# Patient Record
Sex: Male | Born: 1945 | Race: White | Hispanic: No | Smoking: Former smoker
Health system: Southern US, Community
[De-identification: ages and names within clinical notes are randomized; demographics above are authoritative.]

## PROBLEM LIST (undated history)

## (undated) DIAGNOSIS — N189 Chronic kidney disease, unspecified: Secondary | ICD-10-CM

## (undated) DIAGNOSIS — I10 Essential (primary) hypertension: Secondary | ICD-10-CM

## (undated) DIAGNOSIS — J449 Chronic obstructive pulmonary disease, unspecified: Secondary | ICD-10-CM

## (undated) DIAGNOSIS — I6529 Occlusion and stenosis of unspecified carotid artery: Secondary | ICD-10-CM

## (undated) DIAGNOSIS — M1990 Unspecified osteoarthritis, unspecified site: Secondary | ICD-10-CM

## (undated) HISTORY — DX: Essential (primary) hypertension: I10

## (undated) HISTORY — DX: Occlusion and stenosis of unspecified carotid artery: I65.29

## (undated) HISTORY — DX: Unspecified osteoarthritis, unspecified site: M19.90

## (undated) HISTORY — PX: NEPHRECTOMY: SHX65

## (undated) HISTORY — PX: KIDNEY SURGERY: SHX687

## (undated) HISTORY — DX: Chronic kidney disease, unspecified: N18.9

## (undated) HISTORY — PX: MEDIPORT INSERTION, SINGLE: SHX2027

## (undated) HISTORY — PX: GASTROSTOMY TUBE PLACEMENT: SHX655

## (undated) HISTORY — DX: Chronic obstructive pulmonary disease, unspecified: J44.9

## (undated) HISTORY — PX: OTHER SURGICAL HISTORY: SHX169

---

## 2008-01-11 ENCOUNTER — Ambulatory Visit (HOSPITAL_COMMUNITY): Admission: RE | Admit: 2008-01-11 | Discharge: 2008-01-11 | Payer: Self-pay | Admitting: Internal Medicine

## 2008-02-16 HISTORY — PX: FEMORAL-TIBIAL BYPASS GRAFT: SHX938

## 2008-02-21 ENCOUNTER — Ambulatory Visit: Payer: Self-pay | Admitting: Vascular Surgery

## 2008-02-24 ENCOUNTER — Inpatient Hospital Stay (HOSPITAL_COMMUNITY): Admission: RE | Admit: 2008-02-24 | Discharge: 2008-02-26 | Payer: Self-pay | Admitting: Vascular Surgery

## 2008-02-24 ENCOUNTER — Ambulatory Visit: Payer: Self-pay | Admitting: Vascular Surgery

## 2008-02-24 ENCOUNTER — Encounter: Payer: Self-pay | Admitting: Vascular Surgery

## 2008-03-20 ENCOUNTER — Ambulatory Visit: Payer: Self-pay | Admitting: Vascular Surgery

## 2008-07-30 ENCOUNTER — Ambulatory Visit: Payer: Self-pay | Admitting: Vascular Surgery

## 2010-09-30 NOTE — Op Note (Signed)
NAME:  Rodney Wagner, LASOTA NO.:  [id number]   MEDICAL RECORD NO.:  [id number]          PATIENT TYPE:  INP   LOCATION:  2550                         FACILITY:  [facility]   PHYSICIAN:  Quita Skye. Hart Rochester, M.D.  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   OPERATION:  Right common femoral to popliteal (below knee) bypass using  a 6-mm Gore-Tex Propaten graft with intraoperative arteriogram.   SURGEON:  Quita Skye. Hart Rochester, MD   ASSISTANT:  Jerold Coombe, PA   ANESTHESIA:  General endotracheal.   PROCEDURE:  The patient was taken to the operating room, placed in the  supine position at which time, satisfactory general endotracheal  anesthesia was administered.  The right leg was prepped with Betadine  scrub and solution, draped in routine sterile manner.  A short  longitudinal incision was made in the inguinal area, carried down  through subcutaneous tissue and the external iliac, common femoral,  superficial and profunda femoris arteries dissected free.  There was  diffuse calcific plaquing throughout the iliac and common femoral  system, but there was a soft area above the inguinal ligament, which had  an excellent pulse.  This patient had known diffuse iliac occlusive  disease proximally.  Since the saphenous vein appeared to be quite small  on physical exam, it was decided to use primarily a Gore-Tex graft to  the below-knee position.  Therefore, a medial incision was made below  the knee avoiding the injury to the saphenous vein.  Popliteal artery  was exposed in the below-knee position and circled with vessel loops.  It was diffusely diseased proximally, but  became of better quality and  softer down to the origin of the tibioperoneal trunk, which was also  calcified.  Subfascial anatomic tunnel was created and a 6-mm Gore-Tex  Propaten graft delivered through the tunnel and the patient was  heparinized.  The popliteal artery was then occluded proximally and  distally with vessel loops, opened with a 15 blade, and extended with  Potts scissors.  Gore-Tex graft was spatulated and anastomosed end-to-  side with 6-0 Prolene.  Following this, the femoral vessels were  occluded.  Longitudinal opening made in the proximal most portion of the  common femoral artery with 15 blade and extended with Potts scissors.  There was excellent flow.  Gore-Tex anastomosed end-to-side with 6-0  Prolene, clamps released, and there was an excellent pulse in the graft.  Good Doppler flow in the anterior tibial artery.  Intraoperative  arteriogram revealed a widely patent anastomosis with diffuse disease of  the tibioperoneal trunk and the origin of the anterior tibial with few  areas of disease throughout the anterior tibial, but that being the best  vessel.  Protamine was given to reverse the heparin.  Following adequate  hemostasis, the wound  was irrigated with saline, closed in layers with  Vicryl in subcuticular fashion.  Sterile dressing applied and the  patient taken to the recovery room in satisfactory condition.      Quita Skye Hart Rochester, M.D.  Electronically Signed     JDL/MEDQ  D:  02/24/2008  T:  02/24/2008  Job:  [id number]

## 2010-09-30 NOTE — Assessment & Plan Note (Signed)
OFFICE VISIT   Rodney Wagner, Rodney Wagner  DOB:  [birth year]                                       03/20/2008  CHART#:[id number]   The patient is status post right femoral popliteal bypass grafting done  on October 9 for severe claudication in the right leg.  He has done very  well following this graft with complete resolution of his claudication  symptoms, now being able to walk up to a half mile without difficulty.  He is having no rest pain and no incisional pain at this point.  His  biggest complaint is back discomfort.   PHYSICAL EXAMINATION:  On physical exam today blood pressure is 160/89,  heart rate is 83, respirations 14.  He has excellent healing of his  right inguinal and popliteal wounds.  He has no distal edema and a 3+  popliteal graft pulse with a 2+ dorsalis pedis pulse palpable and an ABI  of 1.0 in the vascular lab.   He was reassured regarding these findings.  I have recommended that he  take one 81 mg aspirin tablet per day and he will be in touch with Dr.  Linna Darner for further pain medication for his back discomfort.  We will  follow him on a regular basis in the vascular lab for patency of the  bypass graft.   Quita Skye Hart Rochester, M.D.  Electronically Signed   JDL/MEDQ  D:  03/20/2008  T:  03/21/2008  Job:  [id number]   cc:   Erasmo Downer, MD

## 2010-09-30 NOTE — H&P (Signed)
HISTORY AND PHYSICAL EXAMINATION   February 21, 2008   Re:  Rodney Wagner, FRAYSER                  DOB:  [birth year]   CHIEF COMPLAINT:  Severe claudication right leg with ambulation.   HISTORY OF PRESENT ILLNESS:  This 65 year old male patient was referred  for further evaluation by Dr. Linna Darner because of severe claudication in  the right leg.  This patient states he is unable to walk 50 yards  without having to stop because of severe pain and tightness in his calf  and foot which extends into the lower thigh.  He recently had an  angiogram performed at [facility] which reveals approximate 50%  stenosis in his right external iliac artery with total occlusion of the  right internal iliac artery.  He has total occlusion of the right  superficial femoral artery with a stenosis in the popliteal artery just  above the knee joint with a patent below knee popliteal artery and two  vessel runoff.  He has no history of rest pain, nonhealing ulcers,  infection or ulceration but is being limited quite severely.   PAST MEDICAL HISTORY:  1. Hypertension.  2. History of left nephrectomy as a child for kidney stones.  3. Negative for diabetes, hyperlipidemia, coronary artery disease or      stroke.   PAST SURGICAL HISTORY:  Left nephrectomy.   FAMILY HISTORY:  Positive for stroke in his father who died of a  cerebral hemorrhage.  Negative for coronary artery disease or diabetes.   SOCIAL HISTORY:  He is single, has two children, is disabled because of  back problems.  He smokes a pack of cigarettes per day.  He drinks beer  on a fairly regular basis.   REVIEW OF SYSTEMS:  He does have some weight loss and loss of appetite.  No chest pain, dyspnea on exertion, PND or orthopnea.  He has had a  productive cough at times.  He has urinary frequency.  He does have a  history of depression, nervousness and diffuse joint pain.   ALLERGIES:  None.   MEDICATIONS:  1. Naproxen 500 mg one  b.i.d.  2. Meloxicam 15 mg one daily.  3. Tramadol hydrochloride 50 mg one t.i.d.  4. Diazepam 5 mg one b.i.d. p.r.n.   PHYSICAL EXAMINATION:  Vital signs:  Blood pressure 121/65, heart rate  is 43, respirations 14.  General:  He is a male patient who is in no  apparent distress, alert and oriented x3.  Neck:  Supple, 3+ carotid  pulses palpable.  No bruits are audible.  Neurologic:  Normal.  No  palpable adenopathy in the neck.  Chest:  Clear to auscultation.  Cardiovascular:  Regular rhythm.  No murmurs.  Abdomen:  Soft, nontender  with no masses.  Vascular:  He has 3+ femoral pulse on the right with  absent popliteal and distal pulses.  Left leg has a 3+ femoral, 2+  popliteal, 2+ posterior tibial pulse.  Right foot slightly cooler than  the left.  Saphenous vein appears small on both legs.   Lower extremity arterial Doppler suggests right superficial femoral  occlusive disease with ABI of 0.58 on the right and 1.0 on the left.   IMPRESSION:  1. Right superficial femoral and popliteal occlusive disease with      limiting claudication.  2. Hypertension.  3. History of left nephrectomy.  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  PLAN:  Is to admit the patient on October 9 for an elective right  femoral-popliteal bypass graft with either saphenous vein or Gore-Tex  depending on the size of the vein.  The risks and benefits have been  thoroughly discussed with the patient who would like to proceed.   Quita Skye Hart Rochester, M.D.  Electronically Signed   JDL/MEDQ  D:  02/21/2008  T:  02/22/2008  Job:  [id number]   cc:   Erasmo Downer, MD

## 2010-09-30 NOTE — Assessment & Plan Note (Signed)
OFFICE VISIT   Rodney Wagner, Rodney Wagner  DOB:  04/25/1946                                       07/30/2008  CHART#:[id number]   The patient underwent a right femoral popliteal bypass graft using a 6-  mm Gore-Tex - Propaten graft in October 2009 for severe claudication  symptoms right leg.  Initially he had an excellent result but 2-3 weeks  ago began having recurrent symptoms in his right calf.  He is not having  rest pain and has no history of nonhealing ulcers, infection or  gangrene.  He is able to ambulate about 1/2 to 1 block, at which point  he stops and rests.  He has no symptoms on the contralateral left leg.  He continues to smoke heavily.   PHYSICAL EXAMINATION:  Blood pressure 113/71, heart rate is 91,  respirations 14.  Abdomen:  Soft, nontender with no masses.  He has 3+  femoral pulse on the right with no popliteal or distal pulse.  The right  foot is adequately perfused, with no infection or ulceration, no  tenderness in the calf with good motion and sensation.  The left leg is  free of symptoms.   Lower extremity arterial Dopplers revealed an ABI of 0.74 on the right  compared to 1.02 following his bypass and is 0.99 on the left.  Scan of  the graft reveals it to be totally occluded in the right leg.   I reviewed his intraoperative arteriogram which I performed at the time  of his surgery in October 2009 and he does have severe distal disease  from his tibials down, with only two-vessel runoff and both his anterior  tibial and peroneal arteries are severely diseased.   Because he continues to have significant risk factors including the  smoking and the fact that his saphenous vein is inadequate, I do not  think any further surgery should be done unless it becomes a limb  salvage situation.  I have discussed this with him, he is in total  agreement; so, we will not plan any further surgery unless his symptoms  worsen significantly.   Quita Skye  Hart Rochester, M.D.  Electronically Signed   JDL/MEDQ  D:  07/30/2008  T:  07/31/2008  Job:  [id number]

## 2010-09-30 NOTE — Procedures (Signed)
BYPASS GRAFT EVALUATION   INDICATION:  Follow up right lower extremity bypass graft.   HISTORY:  Diabetes:  No.  Cardiac:  No.  Hypertension:  No.  Smoking:  Yes.  Previous Surgery:  Right fem-pop bypass graft on 02/27/2008.  Other:  Patient states increased right lower extremity claudication over  the past 2 weeks.   SINGLE LEVEL ARTERIAL EXAM                               RIGHT              LEFT  Brachial:                    143                142  Anterior tibial:             106                132  Posterior tibial:            65                 145  Peroneal:  Ankle/brachial index:        0.74               1.01   PREVIOUS ABI:  Date: 03/20/2008  RIGHT:  1.02  LEFT:  0.99   LOWER EXTREMITY BYPASS GRAFT DUPLEX EXAM:   DUPLEX:  Monophasic flow noted in the right common femoral and below-  knee popliteal arteries with no flow detected throughout the right lower  extremity bypass graft.   IMPRESSION:  1. Doppler signals and color flow imaging suggest a total occlusion of      the right fem-pop bypass graft.  2. Significantly decreased right ABI noted when compared to the      previous exam on 03/20/2008, with the left ABI remaining stable.   ___________________________________________  Quita Skye. Hart Rochester, M.D.   CH/MEDQ  D:  07/30/2008  T:  07/30/2008  Job:  [id number]

## 2010-10-03 NOTE — Discharge Summary (Signed)
NAME:  Rodney Wagner, [name]:  [id number]   MEDICAL RECORD NO.:  [id number]          PATIENT TYPE:  INP   LOCATION:  2002                         FACILITY:  [facility]   PHYSICIAN:  Quita Skye. Hart Rochester, M.D.  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   FINAL DIAGNOSES:  Right superficial femoral and tibial occlusive disease  with severe claudication, right leg, status post right popliteal artery  bypass grafting.   SECONDARY DIAGNOSES:  1. Hypertension.  2. History of alcohol abuse.  3. Chronic obstructive pulmonary disease.   PROCEDURES:  On February 24, 2008, he underwent a right common femoral to  popliteal below-knee bypass using a 6-mm Gore-Tex Propaten graft with  intraoperative arteriogram by Dr. Josephina Gip.   On February 24, 2008, ABI pressures on the right showed brachial 136,  triphasic; dorsalis pedis 104 monophasic and posterior tibial 98,  triphasic versus biphasic.  On the left, brachial 140, triphasic;  dorsalis pedis 140, triphasic; and posterior tibia  150 triphasic.   BRIEF HISTORY:  Rodney Wagner is a 65 year old male who Dr. Hart Rochester has been  following for peripheral vascular disease and severe claudication in his  right lower extremity.  Right femoral-popliteal artery bypass grafting  was recommended to improve the circulation and his symptoms.   HOSPITAL COURSE:  Mr. Lesar was electively admitted to the [facility] on February 24, 2008.  He underwent a previously-mentioned  operative procedure.  Postoperatively, he was transferred to the Step-  Down Unit, and felt appropriate for transfer to telemetry unit on postop  day 1.  He had an uneventful postoperative course.  In the initial  postoperative period, he did have frequent  premature atrial  contractions, but otherwise within sinus rhythm.  He had a palpable  right dorsalis pedis pulse.   His postoperative labs showed sodium of 132, potassium 4.7, glucose of  122, and baseline glucose of 97.  His BUN was 5 and creatinine 0.69.  White count of 8.5, hemoglobin 11, hematocrit 32.2, and platelet count  of 300.   On postoperative day #1, his diet was advanced and his Foley catheter  was discontinued.   On postoperative day #2, he was voiding, tolerating regular food and  ambulating without great difficulty.  Pain was well-controlled with oral  medications, thus he was felt appropriate for discharge home.  He was  discharged to home in stable and improved condition on February 26, 2008.   DISCHARGE MEDICATIONS:  1. Tramadol 50 mg p.o. t.i.d.  2. Naprosyn 500 mg b.i.d.  3. Diazepam 5 mg b.i.d.  4. Meloxicam 15 mg p.o. daily.  5. Oxycodone 5 mg 1-2 tablets p.o. q.4 h. p.r.n. pain.   DISCHARGE INSTRUCTIONS:  Continue heart-healthy diet.  Shower and clean  incision gently with soap and water.  No driving or heavy lifting for  the next 2 weeks.  He is to  see Dr. Hart Rochester on March 20, 2008, at 10:30  a.m.  Should call sooner if he develops fever greater than 101 or  evidence of drainage from incision site or increased pain.      Jerold Coombe, P.A.      Quita Skye Hart Rochester, M.D.  Electronically Signed    AWZ/MEDQ  D:  03/26/2008  T:  03/27/2008  Job:  [id number]

## 2011-02-17 LAB — COMPREHENSIVE METABOLIC PANEL
ALT: 15
AST: 24
Albumin: 3.7
Alkaline Phosphatase: 81
CO2: 27
Chloride: 100
GFR calc non Af Amer: >60
Glucose, Bld: 97
Potassium: 4.7
Total Protein: 7.1

## 2011-02-17 LAB — BASIC METABOLIC PANEL
Calcium: 7.9 — ABNORMAL LOW
GFR calc non Af Amer: >60
Glucose, Bld: 122 — ABNORMAL HIGH
Potassium: 4.7

## 2011-02-17 LAB — CROSSMATCH

## 2011-02-17 LAB — CBC
HCT: 33.2 — ABNORMAL LOW
HCT: 41.5
Hemoglobin: 11 — ABNORMAL LOW
MCHC: 32.9
MCV: 94
RBC: 3.53 — ABNORMAL LOW
RDW: 14.6
WBC: 8.6

## 2011-02-17 LAB — APTT: aPTT: 35

## 2011-02-17 LAB — URINALYSIS, ROUTINE W REFLEX MICROSCOPIC
Glucose, UA: NEGATIVE
Hgb urine dipstick: NEGATIVE

## 2011-02-17 LAB — PROTIME-INR: INR: 1

## 2011-02-17 LAB — URINE MICROSCOPIC-ADD ON

## 2011-12-17 ENCOUNTER — Encounter: Payer: Self-pay | Admitting: Vascular Surgery

## 2013-07-10 ENCOUNTER — Other Ambulatory Visit: Payer: Self-pay | Admitting: Family Medicine

## 2015-07-02 DIAGNOSIS — I10 Essential (primary) hypertension: Secondary | ICD-10-CM | POA: Diagnosis not present

## 2015-07-02 DIAGNOSIS — M545 Low back pain: Secondary | ICD-10-CM | POA: Diagnosis not present

## 2015-07-02 DIAGNOSIS — R070 Pain in throat: Secondary | ICD-10-CM | POA: Diagnosis not present

## 2015-07-03 DIAGNOSIS — R070 Pain in throat: Secondary | ICD-10-CM | POA: Diagnosis not present

## 2015-07-03 DIAGNOSIS — R51 Headache: Secondary | ICD-10-CM | POA: Diagnosis not present

## 2015-07-04 DIAGNOSIS — J439 Emphysema, unspecified: Secondary | ICD-10-CM | POA: Diagnosis not present

## 2015-07-04 DIAGNOSIS — R070 Pain in throat: Secondary | ICD-10-CM | POA: Diagnosis not present

## 2015-07-04 DIAGNOSIS — I6523 Occlusion and stenosis of bilateral carotid arteries: Secondary | ICD-10-CM | POA: Diagnosis not present

## 2015-07-04 DIAGNOSIS — R64 Cachexia: Secondary | ICD-10-CM | POA: Diagnosis not present

## 2015-07-04 DIAGNOSIS — R221 Localized swelling, mass and lump, neck: Secondary | ICD-10-CM | POA: Diagnosis not present

## 2015-07-08 DIAGNOSIS — R070 Pain in throat: Secondary | ICD-10-CM | POA: Diagnosis not present

## 2015-07-10 DIAGNOSIS — Z720 Tobacco use: Secondary | ICD-10-CM | POA: Diagnosis not present

## 2015-07-10 DIAGNOSIS — R64 Cachexia: Secondary | ICD-10-CM | POA: Diagnosis not present

## 2015-07-10 DIAGNOSIS — T8182XA Emphysema (subcutaneous) resulting from a procedure, initial encounter: Secondary | ICD-10-CM | POA: Diagnosis not present

## 2015-07-10 DIAGNOSIS — I6523 Occlusion and stenosis of bilateral carotid arteries: Secondary | ICD-10-CM | POA: Diagnosis not present

## 2015-07-10 DIAGNOSIS — C760 Malignant neoplasm of head, face and neck: Secondary | ICD-10-CM | POA: Diagnosis not present

## 2015-07-11 ENCOUNTER — Encounter: Payer: Self-pay | Admitting: Vascular Surgery

## 2015-07-11 ENCOUNTER — Other Ambulatory Visit (HOSPITAL_COMMUNITY): Payer: Self-pay | Admitting: Internal Medicine

## 2015-07-11 DIAGNOSIS — I6523 Occlusion and stenosis of bilateral carotid arteries: Secondary | ICD-10-CM | POA: Diagnosis not present

## 2015-07-11 DIAGNOSIS — J029 Acute pharyngitis, unspecified: Secondary | ICD-10-CM | POA: Diagnosis not present

## 2015-07-11 DIAGNOSIS — Z720 Tobacco use: Secondary | ICD-10-CM | POA: Diagnosis not present

## 2015-07-11 DIAGNOSIS — D104 Benign neoplasm of tonsil: Secondary | ICD-10-CM | POA: Diagnosis not present

## 2015-07-11 DIAGNOSIS — D0008 Carcinoma in situ of pharynx: Secondary | ICD-10-CM | POA: Diagnosis not present

## 2015-07-11 DIAGNOSIS — C140 Malignant neoplasm of pharynx, unspecified: Secondary | ICD-10-CM

## 2015-07-11 DIAGNOSIS — C099 Malignant neoplasm of tonsil, unspecified: Secondary | ICD-10-CM | POA: Diagnosis not present

## 2015-07-12 ENCOUNTER — Encounter: Payer: Self-pay | Admitting: Vascular Surgery

## 2015-07-12 ENCOUNTER — Ambulatory Visit (INDEPENDENT_AMBULATORY_CARE_PROVIDER_SITE_OTHER): Payer: Medicare Other | Admitting: Vascular Surgery

## 2015-07-12 ENCOUNTER — Other Ambulatory Visit: Payer: Self-pay

## 2015-07-12 VITALS — BP 138/70 | HR 68 | Temp 97.2°F | Resp 16 | Ht 69.0 in | Wt 112.0 lb

## 2015-07-12 DIAGNOSIS — I739 Peripheral vascular disease, unspecified: Principal | ICD-10-CM

## 2015-07-12 DIAGNOSIS — I779 Disorder of arteries and arterioles, unspecified: Secondary | ICD-10-CM | POA: Insufficient documentation

## 2015-07-12 NOTE — Progress Notes (Signed)
I spoke with Mr Rodney Wagner, he reported that Dr Bridgett Larsson instructed him to continue to take Aspirin, he did not give any instructions on Pletal. I called Dr Donnetta Hutching , on call for Dr Bridgett Larsson , Dr Early stated that patient can stop Pletal today.  I called patient and instructed him, patient verbalized understanding.

## 2015-07-12 NOTE — Progress Notes (Signed)
New Carotid Patient  Referred by:  Michae Kava, MD [address] [place], [place] [address]  Reason for referral: B carotid stenosis  History of Present Illness  Rodney Wagner is a 70 y.o. (03-26-1946) male prior patient of Dr. Hart Rochester from R CFA to BK pop BPG w/ PTFE who presents with chief complaint: possible cancer.  This patient is in the middle of work up for possible right oral cancer.  As part of that work-up, CT neck was completed which lead to CTA neck to evaluate relationship of possible R cancer and vascular structures.  B ICA high stenoses were found with L ICA string sign.  Patient has no history of TIA or stroke symptom.  The patient has never had amaurosis fugax or monocular blindness.  The patient has never had facial drooping or hemiplegia.  The patient has never had receptive or expressive aphasia.   The patient's risks factors for carotid disease include: HTN, active smoking.  Past Medical History  Diagnosis Date  . Arthritis   . Chronic kidney disease   . COPD (chronic obstructive pulmonary disease) (HCC)   . Hypertension   . Carotid artery occlusion     Past Surgical History  Procedure Laterality Date  . Kidney surgery      Social History   Social History  . Marital Status: Divorced    Spouse Name: N/A  . Number of Children: N/A  . Years of Education: N/A   Occupational History  . Not on file.   Social History Main Topics  . Smoking status: Former Smoker -- 1.00 packs/day    Types: Cigarettes    Quit date: 07/08/2015  . Smokeless tobacco: Never Used  . Alcohol Use: No  . Drug Use: No  . Sexual Activity: Not on file   Other Topics Concern  . Not on file   Social History Narrative     Family History  Problem Relation Age of Onset  . Aneurysm Father     Current Outpatient Prescriptions  Medication Sig Dispense Refill  . amLODipine (NORVASC) 5 MG tablet Take 5 mg by mouth daily.    [name] aspirin 81 MG chewable tablet Chew 81 mg by mouth  daily.    . fentaNYL (DURAGESIC - DOSED MCG/HR) 100 MCG/HR Place 100 mcg onto the skin every 3 (three) days.    [name] lisinopril (PRINIVIL,ZESTRIL) 20 MG tablet Take 20 mg by mouth daily.    . naproxen (NAPROSYN) 500 MG tablet Take 500 mg by mouth 2 (two) times daily with a meal.    . Omega-3 Fatty Acids (FISH OIL) 1000 MG CAPS Take 1,000 mg by mouth 3 (three) times daily.    . traMADol (ULTRAM) 50 MG tablet Take 50 mg by mouth 2 (two) times daily.    . cilostazol (PLETAL) 100 MG tablet Take 1 tablet by mouth 2 (two) times daily.    . diazepam (VALIUM) 5 MG tablet Take 1 tablet by mouth 2 (two) times daily.     No current facility-administered medications for this visit.    No Known Allergies   REVIEW OF SYSTEMS:  (Positives checked otherwise negative)  CARDIOVASCULAR:   [ ]  chest pain,  [ ]  chest pressure,  [ ]  palpitations,  [ ]  shortness of breath when laying flat,  [ ]  shortness of breath with exertion,   [x]  pain in feet when walking,  [ ]  pain in feet when laying flat, [ ]  history of blood clot in  veins (DVT),  [ ]  history of phlebitis,  [ ]  swelling in legs,  [ ]  varicose veins  PULMONARY:   [x]  recurrent cough,  [ ]  asthma,  [ ]  wheezing  NEUROLOGIC:   [ ]  weakness in arms or legs,  [ ]  numbness in arms or legs,  [ ]  difficulty speaking or slurred speech,  [ ]  temporary loss of vision in one eye,  [ ]  dizziness  HEMATOLOGIC:   [ ]  bleeding problems,  [ ]  problems with blood clotting too easily  MUSCULOSKEL:   [ ]  joint pain, [ ]  joint swelling  GASTROINTEST:   [ ]  vomiting blood,  [ ]  blood in stool     GENITOURINARY:   [ ]  burning with urination,  [ ]  blood in urine  PSYCHIATRIC:   [ ]  history of major depression  INTEGUMENTARY:   [ ]  rashes,  [ ]  ulcers  CONSTITUTIONAL:   [ ]  fever,  [ ]  chills   For VQI Use Only  PRE-ADM LIVING: Home  AMB STATUS: Ambulatory  CAD Sx: None  PRIOR CHF: None  STRESS TEST: [x]  No, [ ]  Normal, [ ]  +  ischemia, [ ]  + MI, [ ]  Both   Physical Examination  Filed Vitals:   07/12/15 0919 07/12/15 0928  BP: 128/70 138/70  Pulse: 64 68  Temp: 97.2 F (36.2 C)   Resp: 16   Height: 5\' 9"  (1.753 m)   Weight: 112 lb (50.803 kg)   SpO2: 99%    Body mass index is 16.53 kg/(m^2).   General: A&O x 3, WD, cachectic  Head: [place]/AT  Ear/Nose/Throat: Hearing grossly intact, nares w/o erythema or drainage, oropharynx w/o Erythema/Exudate, Mallampati score: 3  Eyes: PERRLA, EOMI  Neck: Supple, no nuchal rigidity, palpable fullness in submandibular position, palpable LAD (L>R)  Pulmonary: Sym exp, good air movt, CTAB, no rales, rhonchi, & wheezing  Cardiac: RRR, Nl S1, S2, no Murmurs, rubs or gallops  Vascular: Vessel Right Left  Radial Faintly Palpable Faintly Palpable  Brachial Palpable Palpable  Carotid Palpable, without bruit Palpable, without bruit  Aorta  Not palpable N/A  Femoral Palpable Palpable  Popliteal Not palpable Not palpable  PT Not Palpable Not Palpable  DP Not Palpable Not Palpable   Gastrointestinal: soft, NTND, -G/R, - HSM, - masses, - CVAT B  Musculoskeletal: M/S 5/5 throughout , Extremities without ischemic changes , healing incisions in R leg  Neurologic: CN 2-12 intact , Pain and light touch intact in extremities , Motor exam as listed above  Psychiatric: Judgment intact, Mood & affect appropriate for pt's clinical situation  Dermatologic: See M/S exam for extremity exam, no rashes otherwise noted  Lymph : No Axillary, or Inguinal lymphadenopathy    Non-Invasive Vascular Imaging  Outside CTA review:  B ICA >80%, L ICA ring of calcific near occluded, low bifurcations bilaterally  Outside Studies/Documentation 10 pages of outside documents were reviewed including: outside .   Medical Decision Making  Rodney Wagner is a 70 y.o. 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  Based on the patient's vascular studies and examination, I have offered the patient: L CEA.  He is scheduled for 07/15/14.  In the event, R carotid needs to be resected, this patient would be at risk for possible CVA  if not death without intervention on the L ICA. I discussed with the patient the risks, benefits, and alternatives to carotid endarterectomy.   The patient is aware that the risks of carotid endarterectomy include but are not limited to: bleeding, infection, stroke, myocardial infarction, death, cranial nerve injuries both temporary and permanent, neck hematoma, possible airway compromise, labile blood pressure post-operatively, cerebral hyperperfusion syndrome, and possible need for additional interventions in the future.  The patient is aware of the risks and agrees to proceed forward with the procedure.  Would have to see what ENT's plan is in regards to right side, before planning any interventions on that side.  I discussed in depth with the patient the nature of atherosclerosis, and emphasized the importance of maximal medical management including strict control of blood pressure, blood glucose, and lipid levels, obtaining regular exercise, antiplatelet agents, and cessation of smoking.   The patient is currently not on a statin: reportedly not indicated.  The patient is currently on an anti-platelet: ASA.  Patient is also on Pletal.  The patient is aware that without maximal medical management the underlying atherosclerotic disease process will progress, limiting the benefit of any interventions.  Thank you for allowing [place] to participate in this patient's care.   Leonides Sake, MD [facility] Office: [phone] Pager: [phone]  07/12/2015, 6:35 PM

## 2015-07-15 ENCOUNTER — Encounter (HOSPITAL_COMMUNITY): Payer: Self-pay

## 2015-07-15 ENCOUNTER — Telehealth: Payer: Self-pay

## 2015-07-15 ENCOUNTER — Encounter (HOSPITAL_COMMUNITY)
Admission: RE | Admit: 2015-07-15 | Discharge: 2015-07-15 | Disposition: A | Discharge status: Home / Self Care | Payer: Medicare Other | Source: Ambulatory Visit | Attending: Vascular Surgery | Admitting: Vascular Surgery

## 2015-07-15 DIAGNOSIS — J449 Chronic obstructive pulmonary disease, unspecified: Secondary | ICD-10-CM | POA: Diagnosis not present

## 2015-07-15 DIAGNOSIS — Z79899 Other long term (current) drug therapy: Secondary | ICD-10-CM | POA: Insufficient documentation

## 2015-07-15 DIAGNOSIS — N189 Chronic kidney disease, unspecified: Secondary | ICD-10-CM | POA: Diagnosis not present

## 2015-07-15 DIAGNOSIS — I129 Hypertensive chronic kidney disease with stage 1 through stage 4 chronic kidney disease, or unspecified chronic kidney disease: Secondary | ICD-10-CM | POA: Insufficient documentation

## 2015-07-15 DIAGNOSIS — Z7982 Long term (current) use of aspirin: Secondary | ICD-10-CM | POA: Insufficient documentation

## 2015-07-15 DIAGNOSIS — I6523 Occlusion and stenosis of bilateral carotid arteries: Secondary | ICD-10-CM | POA: Insufficient documentation

## 2015-07-15 DIAGNOSIS — N390 Urinary tract infection, site not specified: Secondary | ICD-10-CM

## 2015-07-15 DIAGNOSIS — Z01818 Encounter for other preprocedural examination: Secondary | ICD-10-CM | POA: Insufficient documentation

## 2015-07-15 DIAGNOSIS — Z01812 Encounter for preprocedural laboratory examination: Secondary | ICD-10-CM | POA: Insufficient documentation

## 2015-07-15 DIAGNOSIS — Z87891 Personal history of nicotine dependence: Secondary | ICD-10-CM | POA: Diagnosis not present

## 2015-07-15 DIAGNOSIS — Z905 Acquired absence of kidney: Secondary | ICD-10-CM | POA: Insufficient documentation

## 2015-07-15 DIAGNOSIS — Z95828 Presence of other vascular implants and grafts: Secondary | ICD-10-CM | POA: Diagnosis not present

## 2015-07-15 DIAGNOSIS — Z0183 Encounter for blood typing: Secondary | ICD-10-CM | POA: Diagnosis not present

## 2015-07-15 LAB — URINE MICROSCOPIC-ADD ON: RBC / HPF: NONE SEEN RBC/hpf (ref 0–5)

## 2015-07-15 LAB — URINALYSIS, ROUTINE W REFLEX MICROSCOPIC
BILIRUBIN URINE: NEGATIVE
Glucose, UA: NEGATIVE mg/dL
Hgb urine dipstick: NEGATIVE
KETONES UR: NEGATIVE mg/dL
NITRITE: NEGATIVE
Protein, ur: NEGATIVE mg/dL
Specific Gravity, Urine: 1.016 (ref 1.005–1.030)
pH: 5.5 (ref 5.0–8.0)

## 2015-07-15 LAB — COMPREHENSIVE METABOLIC PANEL
ALBUMIN: 3.9 g/dL (ref 3.5–5.0)
ALT: 9 U/L — ABNORMAL LOW (ref 17–63)
AST: 13 U/L — AB (ref 15–41)
Alkaline Phosphatase: 58 U/L (ref 38–126)
Anion gap: 10 (ref 5–15)
BILIRUBIN TOTAL: 0.5 mg/dL (ref 0.3–1.2)
BUN: 27 mg/dL — AB (ref 6–20)
CHLORIDE: 104 mmol/L (ref 101–111)
CO2: 21 mmol/L — ABNORMAL LOW (ref 22–32)
Calcium: 9.8 mg/dL (ref 8.9–10.3)
Creatinine, Ser: 0.92 mg/dL (ref 0.61–1.24)
GFR calc Af Amer: >60 mL/min (ref >60)
GFR calc non Af Amer: >60 mL/min (ref >60)
GLUCOSE: 105 mg/dL — AB (ref 65–99)
POTASSIUM: 5 mmol/L (ref 3.5–5.1)
Sodium: 135 mmol/L (ref 135–145)
Total Protein: 7.5 g/dL (ref 6.5–8.1)

## 2015-07-15 LAB — TYPE AND SCREEN
ABO/RH(D): O NEG
ANTIBODY SCREEN: NEGATIVE

## 2015-07-15 LAB — CBC
HEMATOCRIT: 29.1 % — AB (ref 39.0–52.0)
Hemoglobin: 10.3 g/dL — ABNORMAL LOW (ref 13.0–17.0)
MCH: 34.9 pg — ABNORMAL HIGH (ref 26.0–34.0)
MCHC: 35.4 g/dL (ref 30.0–36.0)
MCV: 98.6 fL (ref 78.0–100.0)
PLATELETS: 291 10*3/uL (ref 150–400)
RBC: 2.95 MIL/uL — AB (ref 4.22–5.81)
RDW: 14.7 % (ref 11.5–15.5)
WBC: 17.1 10*3/uL — AB (ref 4.0–10.5)

## 2015-07-15 LAB — SURGICAL PCR SCREEN
MRSA, PCR: NEGATIVE
Staphylococcus aureus: NEGATIVE

## 2015-07-15 LAB — PROTIME-INR
INR: 1.04 (ref 0.00–1.49)
Prothrombin Time: 13.8 seconds (ref 11.6–15.2)

## 2015-07-15 LAB — APTT: APTT: 27 s (ref 24–37)

## 2015-07-15 MED ORDER — DEXTROSE 5 % IV SOLN: 1.5000 g | INTRAVENOUS | Status: AC

## 2015-07-15 MED ORDER — SODIUM CHLORIDE 0.9 % IV SOLN: INTRAVENOUS | Status: DC

## 2015-07-15 MED ORDER — LEVOFLOXACIN 750 MG PO TABS: 750.0000 mg | ORAL_TABLET | Freq: Every day | ORAL | Status: AC

## 2015-07-15 NOTE — Progress Notes (Signed)
I notified Michae Kava. At Dr Lianne Moris office of WBC of 17.1,  Urine - rare bacteria, WBC- 6-30.

## 2015-07-15 NOTE — Progress Notes (Signed)
Mr Rodney Wagner denies cough, shortness of breath or chest pain.  Mr Rodney Wagner recently had a biospy by Dr Erik Obey- does not have results at this time.

## 2015-07-15 NOTE — Pre-Procedure Instructions (Signed)
    Nickalas Hemm  07/15/2015     Your procedure is scheduled on Tuesday,February 28.  Report to [facility] Admitting at 5:30 A.M.              Your surgery or procedure is scheduled for 7:30 AM   Call this number if you have problems the morning of surgery:[phone]   Remember:  Do not eat food or drink liquids after midnight.  Take these medicines the morning of surgery with A SIP OF WATER: Amlodipine, Aspirin.                   Take if needed: Tramadol, Valium.                Stop taking Cilostazol and Naproxen, Fish Oil,.   Do not wear jewelry, make-up or nail polish.  Do not wear lotions, powders, or perfumes.               Men may shave face and neck.  Do not bring valuables to the hospital.  [facility] is not responsible for any belongings or valuables.  Contacts, dentures or bridgework may not be worn into surgery.  Leave your suitcase in the car.  After surgery it may be brought to your room.  For patients admitted to the hospital, discharge time will be determined by your treatment team.  Patients discharged the day of surgery will not be allowed to drive home.   Special instructions:  Review  [place] - Preparing For Surgery.  Please read over the following fact sheets that you were given. Pain Booklet, Coughing and Deep Breathing, Blood Transfusion Information, MRSA Information and Surgical Site Infection Prevention

## 2015-07-15 NOTE — Progress Notes (Signed)
Anesthesia Chart Review: Patient is a 70 year old male scheduled for left CEA on 07/16/15 by Dr. Bridgett Larsson. He was just added to the OR schedule on 07/12/15 following his Friday appointment with Dr. Bridgett Larsson. He was being evaluated for possible right oral cancer (ENT Dr. Erik Obey, Oncologist Dr. Audree Camel; see [place]) and CT of the neck showed bilateral high grade ICA stenosis with string sign on the left. According to Dr. Lianne Moris office note, plan includes:  Given the lesion is primarily right side, he may need a neck dissection with possible R carotid resection.  Based on the patient's vascular studies and examination, I have offered the patient: L CEA. He is scheduled for 07/15/14.  In the event, R carotid needs to be resected, this patient would be at risk for possible CVA if not death without intervention on the L ICA.  Would have to see what ENT's plan is in regards to right side, before planning any interventions on that side.  Other history includes recent former smoker (quit 07/08/15), COPD, HTN, arthritis, CKD with history of left nephrectomy/partial bladder excision (age 28 "kidney stone"), PAD s/p right FPBG with PTFE 02/24/08 (Dr. Kellie Simmering). Denied current ETOH use. He had a right tonsillar biopsy by Dr. Erik Obey on [id number], but has not heard the pathology results yet. Dr. 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 Smoking cessation encouraged. A PET scan has been ordered pending biopsy results (scheduled for 07/25/15).   PCP is Dr. Sherrie Sport with [facility], last visit 07/08/15. No cardiac studies or prior EKGs at his office.   Meds include amlodipine, aspirin, Pletal, Valium, fentanyl patch, lisinopril, fish oil, tramadol.  07/04/15 CTA head/neck (PACS): Impression: 1. Abnormal soft tissue thickening and hyper enhancement along the lateral wall of the right oropharynx with a lepidic growth pattern.  All told the tumor encompasses about 3 X 4.7 cm. Small but malignant appearing right level IIA lymph node measuring 1.7 cm long axis. 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 2. High-grade bilateral ICA stenosis (see radiographic string sign on the left).  3. Cachexia. Emphysema.  07/15/15 EKG: SR with frequent PVCs, LAFB, right BBB. 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 He actually has fewer PVCs now (bigemmy pattern is no longer).   Preoperative labs noted. WBC 17.1. H/H 10.3/29.1. PT/PTT WNL. Glucose 105. Cr 0.92. UA showed small leukocytes, negative nitrites. UA WBC on microscopic 6-30 (some in clumps), rare bacteria. He did tell the nurse that he has chronic sinus congestion, but did not appear acutely ill at PAT. (I was not asked to evaluate patient, and he was no longer at his appointment once his labs resulted.) [facility] RN Arbie Cookey was notified of WBC and UA results. Will defer additional recommendations to surgeon.   Patient with severe bilateral ICA stenosis with string sign on the left. Unfortunately, may also have right tonsillar cancer, pathology pending from biopsy last week. His EKG is overall stable since 2009. Labs revealed leukocytosis, so awaiting any additional recommendations from Dr. Bridgett Larsson. If he plans to proceed as scheduled then further evaluation on the day of surgery to ensure patient does not appear acutely ill. He denied cough, SOB, and chest pain at PAT. May need glidescope with known right tonsillar mass. Definitive anesthesia plan following anesthesiologist evaluation.  Myra Gianotti, PA-C [facility] Short Stay  Center/Anesthesiology Phone [phone] 07/15/2015 12:12 PM

## 2015-07-15 NOTE — Pre-Procedure Instructions (Signed)
    Rodney Wagner  07/15/2015     Your procedure is scheduled on Tuesday,February 28.  Report to [facility] Admitting at 5:30 A.M.              Your surgery or procedure is scheduled for 7:30 AM   Call this number if you have problems the morning of surgery:[phone]   Remember:  Do not eat food or drink liquids after midnight.  Take these medicines the morning of surgery with A SIP OF WATER: Amlodipine, Aspirin.                   Take if needed: Tramadol, Valium.                Stop taking Cilostazol and Naproxen, Fish Oil,.   Do not wear jewelry, make-up or nail polish.  Do not wear lotions, powders, or perfumes.               Men may shave face and neck.  Do not bring valuables to the hospital.  [facility] is not responsible for any belongings or valuables.  Contacts, dentures or bridgework may not be worn into surgery.  Leave your suitcase in the car.  After surgery it may be brought to your room.  For patients admitted to the hospital, discharge time will be determined by your treatment team.  Patients discharged the day of surgery will not be allowed to drive home.   Special instructions:  Review   - Preparing For Surgery.  Please read over the following fact sheets that you were given. Pain Booklet, Coughing and Deep Breathing, Blood Transfusion Information, MRSA Information and Surgical Site Infection Prevention

## 2015-07-15 NOTE — Telephone Encounter (Signed)
rec'd new order from Dr. Bridgett Larsson to cancel pt's surgery for left CEA 2/28, and to start Levaquin 750 mg, qd x 5 days.  Pt. Has elevated WBC of 17.1, and abnormal UA.  Per Dr. Bridgett Larsson, reschedule pt's procedure in 2 weeks.  Phone call to pt.  Advised of abnormal UA and elevated WBC, and recommendation per Dr. Bridgett Larsson to cancel surgery 2/28, reschedule in 2 weeks, and order antibiotic for UTI.   Will fax rx to pharmacy.  Pt. advised to drink a lot of water with the antibiotic.   Pt. Verb. understanding.

## 2015-07-15 NOTE — Progress Notes (Signed)
   07/15/15 1020  OBSTRUCTIVE SLEEP APNEA  Have you ever been diagnosed with sleep apnea through a sleep study? No  Do you snore loudly (loud enough to be heard through closed doors)?  1  Do you often feel tired, fatigued, or sleepy during the daytime (such as falling asleep during driving or talking to someone)? 0  Has anyone observed you stop breathing during your sleep? 1  Do you have, or are you being treated for high blood pressure? 1  BMI more than 35 kg/m2? 0  Age > 43 (1-yes) 1  Male Gender (Yes=1) 1  Obstructive Sleep Apnea Score 5  Score 5 or greater  Results sent to PCP

## 2015-07-16 ENCOUNTER — Encounter: Payer: Self-pay | Admitting: Internal Medicine

## 2015-07-24 ENCOUNTER — Other Ambulatory Visit: Payer: Self-pay

## 2015-07-25 ENCOUNTER — Other Ambulatory Visit: Payer: Self-pay

## 2015-07-25 ENCOUNTER — Encounter (HOSPITAL_COMMUNITY)
Admission: RE | Admit: 2015-07-25 | Discharge: 2015-07-25 | Disposition: A | Discharge status: Home / Self Care | Payer: Medicare Other | Source: Ambulatory Visit | Attending: Vascular Surgery | Admitting: Vascular Surgery

## 2015-07-25 ENCOUNTER — Ambulatory Visit (HOSPITAL_COMMUNITY)
Admission: RE | Admit: 2015-07-25 | Discharge: 2015-07-25 | Disposition: A | Discharge status: Home / Self Care | Payer: Medicare Other | Source: Ambulatory Visit | Attending: Internal Medicine | Admitting: Internal Medicine

## 2015-07-25 ENCOUNTER — Telehealth: Payer: Self-pay

## 2015-07-25 DIAGNOSIS — I2510 Atherosclerotic heart disease of native coronary artery without angina pectoris: Secondary | ICD-10-CM | POA: Diagnosis not present

## 2015-07-25 DIAGNOSIS — C760 Malignant neoplasm of head, face and neck: Secondary | ICD-10-CM | POA: Diagnosis not present

## 2015-07-25 DIAGNOSIS — C770 Secondary and unspecified malignant neoplasm of lymph nodes of head, face and neck: Secondary | ICD-10-CM | POA: Insufficient documentation

## 2015-07-25 DIAGNOSIS — Z905 Acquired absence of kidney: Secondary | ICD-10-CM | POA: Insufficient documentation

## 2015-07-25 DIAGNOSIS — C140 Malignant neoplasm of pharynx, unspecified: Secondary | ICD-10-CM

## 2015-07-25 LAB — URINALYSIS, ROUTINE W REFLEX MICROSCOPIC
Bilirubin Urine: NEGATIVE
GLUCOSE, UA: NEGATIVE mg/dL
HGB URINE DIPSTICK: NEGATIVE
Ketones, ur: NEGATIVE mg/dL
Nitrite: NEGATIVE
Protein, ur: NEGATIVE mg/dL
SPECIFIC GRAVITY, URINE: 1.015 (ref 1.005–1.030)
pH: 5.5 (ref 5.0–8.0)

## 2015-07-25 LAB — URINE MICROSCOPIC-ADD ON

## 2015-07-25 LAB — CBC
HCT: 27.6 % — ABNORMAL LOW (ref 39.0–52.0)
Hemoglobin: 9.4 g/dL — ABNORMAL LOW (ref 13.0–17.0)
MCH: 33.3 pg (ref 26.0–34.0)
MCHC: 34.1 g/dL (ref 30.0–36.0)
MCV: 97.9 fL (ref 78.0–100.0)
PLATELETS: 283 10*3/uL (ref 150–400)
RBC: 2.82 MIL/uL — ABNORMAL LOW (ref 4.22–5.81)
RDW: 15.1 % (ref 11.5–15.5)
WBC: 11.1 10*3/uL — ABNORMAL HIGH (ref 4.0–10.5)

## 2015-07-25 LAB — BASIC METABOLIC PANEL
Anion gap: 8 (ref 5–15)
BUN: 40 mg/dL — ABNORMAL HIGH (ref 6–20)
CHLORIDE: 107 mmol/L (ref 101–111)
CO2: 22 mmol/L (ref 22–32)
CREATININE: 1.14 mg/dL (ref 0.61–1.24)
Calcium: 9.7 mg/dL (ref 8.9–10.3)
GFR calc non Af Amer: >60 mL/min (ref >60)
Glucose, Bld: 103 mg/dL — ABNORMAL HIGH (ref 65–99)
POTASSIUM: 5 mmol/L (ref 3.5–5.1)
Sodium: 137 mmol/L (ref 135–145)

## 2015-07-25 LAB — GLUCOSE, CAPILLARY: Glucose-Capillary: 96 mg/dL (ref 65–99)

## 2015-07-25 MED ORDER — FLUDEOXYGLUCOSE F - 18 (FDG) INJECTION
5.7300 | Freq: Once | INTRAVENOUS | Status: AC | PRN
  Administered 2015-07-25: 5.73 via INTRAVENOUS

## 2015-07-25 NOTE — Telephone Encounter (Signed)
-----  Message from [name], MD sent at 07/25/2015  2:20 PM EST ----- Rodney Wagner [id number] 1945-11-19  Would treat him for UTI

## 2015-07-25 NOTE — Telephone Encounter (Signed)
Discussed pt's UA with Dr. Bridgett Larsson.  Pt. was recently treated for UTI with Levaquin 750 mg qd x 1 week.  Per Dr. Bridgett Larsson, d/c order to treat for UTI today, and obtain CCU for urine culture, the AM of surgery, 3/14.

## 2015-07-25 NOTE — Pre-Procedure Instructions (Signed)
    Tashon Delee  07/25/2015      [facility] - [place], [place] [address] Phone: [phone] Fax: [phone]    Your procedure is scheduled on Tuesday, March 14th, 2017.  Report to [facility] Admitting at 5:30 A.M.  Call this number if you have problems the morning of surgery:  [phone]   Remember:  Do not eat food or drink liquids after midnight.   Take these medicines the morning of surgery with A SIP OF WATER : Amlodipine (Norvasc), Aspirin, Diazepam (Valium) if needed, Tramadol (Ultram) if needed.  Stop taking: Cilostazol (Pletal), Naproxen, Fish Oil.    Do not wear jewelry.  Do not wear lotions, powders, or cologones.    Men may shave face and neck.  Do not bring valuables to the hospital.  [facility] is not responsible for any belongings or valuables.  Contacts, dentures or bridgework may not be worn into surgery.  Leave your suitcase in the car.  After surgery it may be brought to your room.  For patients admitted to the hospital, discharge time will be determined by your treatment team.  Patients discharged the day of surgery will not be allowed to drive home.   Special instructions:  See attached.

## 2015-07-26 NOTE — Progress Notes (Signed)
Anesthesia follow-up: See my note from 07/15/15. 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 Erik Obey). CT scan showed bilateral high grade ICA stenosis with string sing on the left, and he was referred to vascular surgeon Dr. Bridgett Larsson. His plan included:   Given the lesion is primarily right side, he may need a neck dissection with possible R carotid resection.  Based on the patient's vascular studies and examination, I have offered the patient: L CEA. He is scheduled for 07/15/14.  In the event, R carotid needs to be resected, this patient would be at risk for possible CVA if not death without intervention on the L ICA.  Would have to see what ENT's plan is in regards to right side, before planning any interventions on that side.  Surgery had been scheduled for 07/16/15, but was apparently postponed until 07/30/15 due to leukocytosis and 6-30 WBC on urine microscopic.  He was treated with a five day course of Levaquin. Yesterday he had repeat labs that showed Cr 1.14, glucose 103. H/H 9.4/27.6 (previously 10./29.1 on 07/15/15). T&S done. UA showed small leukocytes, few bacteria, 6-30 WBC. According to [facility] RN note, Dr. Bridgett Larsson reviewed UA results and has ordered a "CCU for urine culture" for the morning of surgery. His EKG was stable when compared to his 2009 tracing which was done prior to FPBG (copy printed from [facility] and placed on chart). Patient is scheduled for HEM-ONC follow-up with Dr. Jacquiline Doe and with RAD-ONC post-operatively. Will defer decision for perioperative transfusion to surgeon and/or anesthesiologist.  Myra Gianotti, PA-C [facility] Short Stay Center/Anesthesiology Phone [phone] 07/26/2015 10:40 AM

## 2015-07-29 MED ORDER — DEXTROSE 5 % IV SOLN
1.5000 g | INTRAVENOUS | Status: AC
  Administered 2015-07-30: 1.5 g via INTRAVENOUS
  Filled 2015-07-29: qty 1.5

## 2015-07-29 MED ORDER — SODIUM CHLORIDE 0.9 % IV SOLN
INTRAVENOUS | Status: DC
Start: 2015-07-30 — End: 2015-07-30

## 2015-07-30 ENCOUNTER — Encounter (HOSPITAL_COMMUNITY): Payer: Self-pay | Admitting: Critical Care Medicine

## 2015-07-30 ENCOUNTER — Telehealth: Payer: Self-pay | Admitting: Vascular Surgery

## 2015-07-30 ENCOUNTER — Inpatient Hospital Stay (HOSPITAL_COMMUNITY)
Admission: RE | Admit: 2015-07-30 | Discharge: 2015-07-31 | DRG: 039 | Disposition: A | Discharge status: Home / Self Care | Payer: Medicare Other | Source: Ambulatory Visit | Attending: Vascular Surgery | Admitting: Vascular Surgery

## 2015-07-30 ENCOUNTER — Inpatient Hospital Stay (HOSPITAL_COMMUNITY): Payer: Medicare Other | Admitting: Vascular Surgery

## 2015-07-30 ENCOUNTER — Encounter (HOSPITAL_COMMUNITY)
Admission: RE | Disposition: A | Discharge status: Home / Self Care | Payer: Self-pay | Source: Ambulatory Visit | Attending: Vascular Surgery

## 2015-07-30 DIAGNOSIS — C099 Malignant neoplasm of tonsil, unspecified: Secondary | ICD-10-CM | POA: Diagnosis present

## 2015-07-30 DIAGNOSIS — N189 Chronic kidney disease, unspecified: Secondary | ICD-10-CM | POA: Diagnosis not present

## 2015-07-30 DIAGNOSIS — Z87891 Personal history of nicotine dependence: Secondary | ICD-10-CM

## 2015-07-30 DIAGNOSIS — M1990 Unspecified osteoarthritis, unspecified site: Secondary | ICD-10-CM | POA: Diagnosis present

## 2015-07-30 DIAGNOSIS — D638 Anemia in other chronic diseases classified elsewhere: Secondary | ICD-10-CM | POA: Diagnosis present

## 2015-07-30 DIAGNOSIS — I739 Peripheral vascular disease, unspecified: Secondary | ICD-10-CM | POA: Diagnosis present

## 2015-07-30 DIAGNOSIS — I6523 Occlusion and stenosis of bilateral carotid arteries: Principal | ICD-10-CM | POA: Diagnosis present

## 2015-07-30 DIAGNOSIS — I6522 Occlusion and stenosis of left carotid artery: Secondary | ICD-10-CM | POA: Diagnosis not present

## 2015-07-30 DIAGNOSIS — Z791 Long term (current) use of non-steroidal anti-inflammatories (NSAID): Secondary | ICD-10-CM | POA: Diagnosis not present

## 2015-07-30 DIAGNOSIS — Z7982 Long term (current) use of aspirin: Secondary | ICD-10-CM | POA: Diagnosis not present

## 2015-07-30 DIAGNOSIS — Z888 Allergy status to other drugs, medicaments and biological substances status: Secondary | ICD-10-CM

## 2015-07-30 DIAGNOSIS — J449 Chronic obstructive pulmonary disease, unspecified: Secondary | ICD-10-CM | POA: Diagnosis not present

## 2015-07-30 DIAGNOSIS — I6529 Occlusion and stenosis of unspecified carotid artery: Secondary | ICD-10-CM | POA: Diagnosis present

## 2015-07-30 HISTORY — PX: ENDARTERECTOMY: SHX5162

## 2015-07-30 HISTORY — PX: PATCH ANGIOPLASTY: SHX6230

## 2015-07-30 LAB — URINALYSIS, ROUTINE W REFLEX MICROSCOPIC
Bilirubin Urine: NEGATIVE
Glucose, UA: NEGATIVE mg/dL
Hgb urine dipstick: NEGATIVE
Ketones, ur: NEGATIVE mg/dL
NITRITE: NEGATIVE
Protein, ur: NEGATIVE mg/dL
SPECIFIC GRAVITY, URINE: 1.016 (ref 1.005–1.030)
pH: 5.5 (ref 5.0–8.0)

## 2015-07-30 LAB — CBC
HCT: 21.4 % — ABNORMAL LOW (ref 39.0–52.0)
HEMATOCRIT: 20.7 % — AB (ref 39.0–52.0)
Hemoglobin: 7.6 g/dL — ABNORMAL LOW (ref 13.0–17.0)
Hemoglobin: 7.3 g/dL — ABNORMAL LOW (ref 13.0–17.0)
MCH: 34.7 pg — AB (ref 26.0–34.0)
MCH: 34.6 pg — AB (ref 26.0–34.0)
MCHC: 35.5 g/dL (ref 30.0–36.0)
MCHC: 35.3 g/dL (ref 30.0–36.0)
MCV: 97.7 fL (ref 78.0–100.0)
MCV: 98.1 fL (ref 78.0–100.0)
PLATELETS: 234 10*3/uL (ref 150–400)
PLATELETS: 247 10*3/uL (ref 150–400)
RBC: 2.19 MIL/uL — ABNORMAL LOW (ref 4.22–5.81)
RBC: 2.11 MIL/uL — ABNORMAL LOW (ref 4.22–5.81)
RDW: 15.3 % (ref 11.5–15.5)
RDW: 15.4 % (ref 11.5–15.5)
WBC: 15.3 10*3/uL — ABNORMAL HIGH (ref 4.0–10.5)
WBC: 14.8 10*3/uL — AB (ref 4.0–10.5)

## 2015-07-30 LAB — URINE MICROSCOPIC-ADD ON: RBC / HPF: NONE SEEN RBC/hpf (ref 0–5)

## 2015-07-30 LAB — CREATININE, SERUM
CREATININE: 1.01 mg/dL (ref 0.61–1.24)
GFR calc Af Amer: >60 mL/min (ref >60)
GFR calc non Af Amer: >60 mL/min (ref >60)

## 2015-07-30 LAB — PREPARE RBC (CROSSMATCH)

## 2015-07-30 SURGERY — ENDARTERECTOMY, CAROTID
Anesthesia: General | Site: Neck | Laterality: Left

## 2015-07-30 MED ORDER — TRAMADOL HCL 50 MG PO TABS
50.0000 mg | ORAL_TABLET | Freq: Two times a day (BID) | ORAL | Status: DC
  Administered 2015-07-31: 50 mg via ORAL
  Filled 2015-07-30 (×2): qty 1

## 2015-07-30 MED ORDER — LEVOFLOXACIN 750 MG PO TABS: 750.0000 mg | ORAL_TABLET | Freq: Every day | ORAL | Status: DC

## 2015-07-30 MED ORDER — POTASSIUM CHLORIDE CRYS ER 20 MEQ PO TBCR: 20.0000 meq | EXTENDED_RELEASE_TABLET | Freq: Every day | ORAL | Status: DC | PRN

## 2015-07-30 MED ORDER — ENOXAPARIN SODIUM 40 MG/0.4ML ~~LOC~~ SOLN: 40.0000 mg | SUBCUTANEOUS | Status: DC

## 2015-07-30 MED ORDER — DOCUSATE SODIUM 100 MG PO CAPS: 100.0000 mg | ORAL_CAPSULE | Freq: Every day | ORAL | Status: DC

## 2015-07-30 MED ORDER — SODIUM CHLORIDE 0.9 % IV SOLN
INTRAVENOUS | Status: DC | PRN
  Administered 2015-07-30: 500 mL

## 2015-07-30 MED ORDER — ROCURONIUM BROMIDE 50 MG/5ML IV SOLN
INTRAVENOUS | Status: AC
  Filled 2015-07-30: qty 1

## 2015-07-30 MED ORDER — SODIUM CHLORIDE 0.9 % IV SOLN: Freq: Once | INTRAVENOUS | Status: DC

## 2015-07-30 MED ORDER — LISINOPRIL 20 MG PO TABS: 20.0000 mg | ORAL_TABLET | Freq: Every day | ORAL | Status: DC

## 2015-07-30 MED ORDER — FENTANYL 100 MCG/HR TD PT72: 100.0000 ug | MEDICATED_PATCH | TRANSDERMAL | Status: DC

## 2015-07-30 MED ORDER — ALUM & MAG HYDROXIDE-SIMETH 200-200-20 MG/5ML PO SUSP: 15.0000 mL | ORAL | Status: DC | PRN

## 2015-07-30 MED ORDER — ROCURONIUM BROMIDE 100 MG/10ML IV SOLN
INTRAVENOUS | Status: DC | PRN
  Administered 2015-07-30: 40 mg via INTRAVENOUS

## 2015-07-30 MED ORDER — ENSURE ENLIVE PO LIQD: 237.0000 mL | Freq: Two times a day (BID) | ORAL | Status: DC

## 2015-07-30 MED ORDER — FENTANYL CITRATE (PF) 100 MCG/2ML IJ SOLN
INTRAMUSCULAR | Status: DC | PRN
  Administered 2015-07-30: 100 ug via INTRAVENOUS
  Administered 2015-07-30: 50 ug via INTRAVENOUS
  Administered 2015-07-30 (×2): 25 ug via INTRAVENOUS
  Administered 2015-07-30: 50 ug via INTRAVENOUS

## 2015-07-30 MED ORDER — FENTANYL CITRATE (PF) 250 MCG/5ML IJ SOLN
INTRAMUSCULAR | Status: AC
  Filled 2015-07-30: qty 5

## 2015-07-30 MED ORDER — ALBUMIN HUMAN 5 % IV SOLN
12.5000 g | Freq: Once | INTRAVENOUS | Status: AC
  Administered 2015-07-30: 12.5 g via INTRAVENOUS

## 2015-07-30 MED ORDER — DEXTRAN 40 IN SALINE 10-0.9 % IV SOLN
INTRAVENOUS | Status: AC
  Filled 2015-07-30: qty 500

## 2015-07-30 MED ORDER — ONDANSETRON HCL 4 MG/2ML IJ SOLN: 4.0000 mg | Freq: Four times a day (QID) | INTRAMUSCULAR | Status: DC | PRN

## 2015-07-30 MED ORDER — EPHEDRINE SULFATE 50 MG/ML IJ SOLN
INTRAMUSCULAR | Status: AC
  Filled 2015-07-30: qty 1

## 2015-07-30 MED ORDER — MORPHINE SULFATE (PF) 2 MG/ML IV SOLN
2.0000 mg | INTRAVENOUS | Status: DC | PRN
  Administered 2015-07-30: 2 mg via INTRAVENOUS
  Filled 2015-07-30: qty 1

## 2015-07-30 MED ORDER — ALBUMIN HUMAN 5 % IV SOLN
INTRAVENOUS | Status: AC
  Administered 2015-07-30: 12.5 g via INTRAVENOUS
  Filled 2015-07-30: qty 250

## 2015-07-30 MED ORDER — SUCCINYLCHOLINE CHLORIDE 20 MG/ML IJ SOLN
INTRAMUSCULAR | Status: AC
  Filled 2015-07-30: qty 1

## 2015-07-30 MED ORDER — GUAIFENESIN-DM 100-10 MG/5ML PO SYRP: 15.0000 mL | ORAL_SOLUTION | ORAL | Status: DC | PRN

## 2015-07-30 MED ORDER — LABETALOL HCL 5 MG/ML IV SOLN: 10.0000 mg | INTRAVENOUS | Status: DC | PRN

## 2015-07-30 MED ORDER — FENTANYL CITRATE (PF) 100 MCG/2ML IJ SOLN: 25.0000 ug | INTRAMUSCULAR | Status: DC | PRN

## 2015-07-30 MED ORDER — METOPROLOL TARTRATE 1 MG/ML IV SOLN: 2.0000 mg | INTRAVENOUS | Status: DC | PRN

## 2015-07-30 MED ORDER — CHLORHEXIDINE GLUCONATE 4 % EX LIQD: 60.0000 mL | Freq: Once | CUTANEOUS | Status: DC

## 2015-07-30 MED ORDER — PANTOPRAZOLE SODIUM 40 MG PO TBEC: 40.0000 mg | DELAYED_RELEASE_TABLET | Freq: Every day | ORAL | Status: DC

## 2015-07-30 MED ORDER — ASPIRIN 81 MG PO CHEW: 81.0000 mg | CHEWABLE_TABLET | Freq: Every day | ORAL | Status: DC

## 2015-07-30 MED ORDER — MAGNESIUM SULFATE 2 GM/50ML IV SOLN: 2.0000 g | Freq: Every day | INTRAVENOUS | Status: DC | PRN

## 2015-07-30 MED ORDER — SENNOSIDES-DOCUSATE SODIUM 8.6-50 MG PO TABS: 1.0000 | ORAL_TABLET | Freq: Every evening | ORAL | Status: DC | PRN

## 2015-07-30 MED ORDER — HYDRALAZINE HCL 20 MG/ML IJ SOLN: 5.0000 mg | INTRAMUSCULAR | Status: DC | PRN

## 2015-07-30 MED ORDER — DEXTROSE 5 % IV SOLN
1.5000 g | Freq: Two times a day (BID) | INTRAVENOUS | Status: AC
  Administered 2015-07-30 – 2015-07-31 (×2): 1.5 g via INTRAVENOUS
  Filled 2015-07-30 (×2): qty 1.5

## 2015-07-30 MED ORDER — NAPROXEN 250 MG PO TABS
500.0000 mg | ORAL_TABLET | Freq: Two times a day (BID) | ORAL | Status: DC
Start: 2015-07-30 — End: 2015-07-31
  Administered 2015-07-30 – 2015-07-31 (×2): 500 mg via ORAL
  Filled 2015-07-30 (×2): qty 2

## 2015-07-30 MED ORDER — 0.9 % SODIUM CHLORIDE (POUR BTL) OPTIME
TOPICAL | Status: DC | PRN
  Administered 2015-07-30: 2000 mL

## 2015-07-30 MED ORDER — ACETAMINOPHEN 650 MG RE SUPP: 325.0000 mg | RECTAL | Status: DC | PRN

## 2015-07-30 MED ORDER — GLYCOPYRROLATE 0.2 MG/ML IJ SOLN
INTRAMUSCULAR | Status: DC | PRN
  Administered 2015-07-30: 0.4 mg via INTRAVENOUS
  Administered 2015-07-30 (×2): 0.1 mg via INTRAVENOUS

## 2015-07-30 MED ORDER — LIDOCAINE HCL (CARDIAC) 20 MG/ML IV SOLN
INTRAVENOUS | Status: AC
  Filled 2015-07-30: qty 10

## 2015-07-30 MED ORDER — LACTATED RINGERS IV SOLN
INTRAVENOUS | Status: DC | PRN
  Administered 2015-07-30: 07:00:00 via INTRAVENOUS

## 2015-07-30 MED ORDER — PROTAMINE SULFATE 10 MG/ML IV SOLN
INTRAVENOUS | Status: DC | PRN
  Administered 2015-07-30: 30 mg via INTRAVENOUS

## 2015-07-30 MED ORDER — OXYCODONE HCL 5 MG PO TABS
5.0000 mg | ORAL_TABLET | ORAL | Status: DC | PRN
  Administered 2015-07-30 – 2015-07-31 (×3): 10 mg via ORAL
  Filled 2015-07-30 (×3): qty 2

## 2015-07-30 MED ORDER — LIDOCAINE HCL (PF) 1 % IJ SOLN
INTRAMUSCULAR | Status: AC
  Filled 2015-07-30: qty 30

## 2015-07-30 MED ORDER — ACETAMINOPHEN 325 MG PO TABS: 325.0000 mg | ORAL_TABLET | ORAL | Status: DC | PRN

## 2015-07-30 MED ORDER — BISACODYL 10 MG RE SUPP: 10.0000 mg | Freq: Every day | RECTAL | Status: DC | PRN

## 2015-07-30 MED ORDER — DEXTRAN 40 IN SALINE 10-0.9 % IV SOLN
INTRAVENOUS | Status: DC | PRN
  Administered 2015-07-30: 500 mL

## 2015-07-30 MED ORDER — DIAZEPAM 5 MG PO TABS
5.0000 mg | ORAL_TABLET | Freq: Two times a day (BID) | ORAL | Status: DC
  Administered 2015-07-30 – 2015-07-31 (×2): 5 mg via ORAL
  Filled 2015-07-30 (×2): qty 1

## 2015-07-30 MED ORDER — SODIUM CHLORIDE 0.9 % IV SOLN
INTRAVENOUS | Status: DC
  Administered 2015-07-30: 13:00:00 via INTRAVENOUS

## 2015-07-30 MED ORDER — PROPOFOL 10 MG/ML IV BOLUS
INTRAVENOUS | Status: DC | PRN
  Administered 2015-07-30: 10 mg via INTRAVENOUS
  Administered 2015-07-30: 120 mg via INTRAVENOUS

## 2015-07-30 MED ORDER — EPHEDRINE SULFATE 50 MG/ML IJ SOLN
INTRAMUSCULAR | Status: DC | PRN
  Administered 2015-07-30 (×2): 5 mg via INTRAVENOUS

## 2015-07-30 MED ORDER — NEOSTIGMINE METHYLSULFATE 10 MG/10ML IV SOLN
INTRAVENOUS | Status: DC | PRN
  Administered 2015-07-30: 3 mg via INTRAVENOUS

## 2015-07-30 MED ORDER — CILOSTAZOL 100 MG PO TABS
100.0000 mg | ORAL_TABLET | Freq: Two times a day (BID) | ORAL | Status: DC
  Administered 2015-07-30: 100 mg via ORAL
  Filled 2015-07-30 (×3): qty 1

## 2015-07-30 MED ORDER — PHENYLEPHRINE HCL 10 MG/ML IJ SOLN
10.0000 mg | INTRAVENOUS | Status: DC | PRN
  Administered 2015-07-30: 10:00:00 via INTRAVENOUS
  Administered 2015-07-30: 50 ug/min via INTRAVENOUS

## 2015-07-30 MED ORDER — PHENOL 1.4 % MT LIQD
1.0000 | OROMUCOSAL | Status: DC | PRN
Start: 2015-07-30 — End: 2015-07-31

## 2015-07-30 MED ORDER — HEPARIN SODIUM (PORCINE) 1000 UNIT/ML IJ SOLN
INTRAMUSCULAR | Status: DC | PRN
  Administered 2015-07-30: 5000 [IU] via INTRAVENOUS
  Administered 2015-07-30: 2000 [IU] via INTRAVENOUS

## 2015-07-30 MED ORDER — SODIUM CHLORIDE 0.9 % IV SOLN: 500.0000 mL | Freq: Once | INTRAVENOUS | Status: DC | PRN

## 2015-07-30 MED ORDER — PROPOFOL 10 MG/ML IV BOLUS
INTRAVENOUS | Status: AC
  Filled 2015-07-30: qty 20

## 2015-07-30 MED ORDER — ONDANSETRON HCL 4 MG/2ML IJ SOLN: 4.0000 mg | Freq: Once | INTRAMUSCULAR | Status: DC | PRN

## 2015-07-30 MED ORDER — ONDANSETRON HCL 4 MG/2ML IJ SOLN
INTRAMUSCULAR | Status: DC | PRN
  Administered 2015-07-30: 4 mg via INTRAVENOUS

## 2015-07-30 MED ORDER — AMLODIPINE BESYLATE 5 MG PO TABS: 5.0000 mg | ORAL_TABLET | Freq: Every day | ORAL | Status: DC

## 2015-07-30 MED ORDER — HEMOSTATIC AGENTS (NO CHARGE) OPTIME
TOPICAL | Status: DC | PRN
  Administered 2015-07-30: 1 via TOPICAL

## 2015-07-30 SURGICAL SUPPLY — 55 items
BAG DECANTER FOR FLEXI CONT (MISCELLANEOUS) ×3 IMPLANT
CANISTER SUCTION 2500CC (MISCELLANEOUS) ×3 IMPLANT
CATH ROBINSON RED A/P 18FR (CATHETERS) ×3 IMPLANT
CATH SUCT 10FR WHISTLE TIP (CATHETERS) IMPLANT
CLIP TI MEDIUM 6 (CLIP) ×3 IMPLANT
CLIP TI WIDE RED SMALL 6 (CLIP) ×3 IMPLANT
CONT SPEC 4OZ CLIKSEAL STRL BL (MISCELLANEOUS) ×3 IMPLANT
COVER PROBE W GEL 5X96 (DRAPES) IMPLANT
CRADLE DONUT ADULT HEAD (MISCELLANEOUS) ×3 IMPLANT
ELECT REM PT RETURN 9FT ADLT (ELECTROSURGICAL) ×3
ELECTRODE REM PT RTRN 9FT ADLT (ELECTROSURGICAL) ×1 IMPLANT
GAUZE SPONGE 4X4 12PLY STRL (GAUZE/BANDAGES/DRESSINGS) ×6 IMPLANT
GLOVE BIO SURGEON STRL SZ7 (GLOVE) ×3 IMPLANT
GLOVE BIOGEL PI IND STRL 6.5 (GLOVE) ×3 IMPLANT
GLOVE BIOGEL PI IND STRL 7.0 (GLOVE) ×1 IMPLANT
GLOVE BIOGEL PI IND STRL 7.5 (GLOVE) ×3 IMPLANT
GLOVE BIOGEL PI INDICATOR 6.5 (GLOVE) ×6
GLOVE BIOGEL PI INDICATOR 7.0 (GLOVE) ×2
GLOVE BIOGEL PI INDICATOR 7.5 (GLOVE) ×6
GLOVE ECLIPSE 6.5 STRL STRAW (GLOVE) ×3 IMPLANT
GLOVE ECLIPSE 7.0 STRL STRAW (GLOVE) ×6 IMPLANT
GLOVE SKINSENSE NS SZ6.5 (GLOVE) ×2
GLOVE SKINSENSE STRL SZ6.5 (GLOVE) ×1 IMPLANT
GLOVE SURG SS PI 6.5 STRL IVOR (GLOVE) ×3 IMPLANT
GOWN STRL REUS W/ TWL LRG LVL3 (GOWN DISPOSABLE) ×3 IMPLANT
GOWN STRL REUS W/TWL LRG LVL3 (GOWN DISPOSABLE) ×6
HEMOSTAT SPONGE AVITENE ULTRA (HEMOSTASIS) ×3 IMPLANT
IV ADAPTER SYR DOUBLE MALE LL (MISCELLANEOUS) IMPLANT
KIT BASIN OR (CUSTOM PROCEDURE TRAY) ×3 IMPLANT
KIT ROOM TURNOVER OR (KITS) ×3 IMPLANT
KIT SHUNT ARGYLE CAROTID ART 6 (VASCULAR PRODUCTS) ×3 IMPLANT
LIQUID BAND (GAUZE/BANDAGES/DRESSINGS) ×3 IMPLANT
NS IRRIG 1000ML POUR BTL (IV SOLUTION) ×9 IMPLANT
PACK CAROTID (CUSTOM PROCEDURE TRAY) ×3 IMPLANT
PAD ARMBOARD 7.5X6 YLW CONV (MISCELLANEOUS) ×6 IMPLANT
PATCH VASC XENOSURE 1CMX6CM (Vascular Products) ×2 IMPLANT
PATCH VASC XENOSURE 1X6 (Vascular Products) ×1 IMPLANT
SET COLLECT BLD 21X3/4 12 PB (MISCELLANEOUS) IMPLANT
SHUNT CAROTID BYPASS 10 (VASCULAR PRODUCTS) IMPLANT
SHUNT CAROTID BYPASS 12FRX15.5 (VASCULAR PRODUCTS) IMPLANT
SPONGE INTESTINAL PEANUT (DISPOSABLE) ×3 IMPLANT
STOPCOCK 4 WAY LG BORE MALE ST (IV SETS) IMPLANT
SUT ETHILON 3 0 PS 1 (SUTURE) IMPLANT
SUT MNCRL AB 4-0 PS2 18 (SUTURE) ×3 IMPLANT
SUT PROLENE 6 0 BV (SUTURE) ×9 IMPLANT
SUT PROLENE 7 0 BV 1 (SUTURE) IMPLANT
SUT SILK 3 0 TIES 17X18 (SUTURE)
SUT SILK 3-0 18XBRD TIE BLK (SUTURE) IMPLANT
SUT VIC AB 3-0 SH 27 (SUTURE) ×2
SUT VIC AB 3-0 SH 27X BRD (SUTURE) ×1 IMPLANT
SYR TB 1ML LUER SLIP (SYRINGE) IMPLANT
SYSTEM CHEST DRAIN TLS 7FR (DRAIN) IMPLANT
TUBING ART PRESS 48 MALE/FEM (TUBING) IMPLANT
TUBING EXTENTION W/L.L. (IV SETS) IMPLANT
WATER STERILE IRR 1000ML POUR (IV SOLUTION) ×3 IMPLANT

## 2015-07-30 NOTE — H&P (Signed)
Brief History and Physical  History of Present Illness  Rodney Wagner is a 70 y.o. male who presents with chief complaint: R side oral cancer.  The patient presents today for L CEA.  This patient has known bilateral ICA stenosis with string sign on left.  He presents for L CEA after being reschedule from two weeks ago due to bronchitis sx and elevated WBC.  He was placed on a course of Levaquin.  His UA was abnormal at that time.  At thsi time, his bronchitis sx have resolved..    Past Medical History  Diagnosis Date  . Arthritis   . Chronic kidney disease   . COPD (chronic obstructive pulmonary disease) ([place])   . Hypertension   . Carotid artery occlusion     Past Surgical History  Procedure Laterality Date  . Kidney surgery    . Nephrectomy Left     age 70- kidney stone  . Partial bladder removal      Age 35- 1/3 of bladder  . Femoral-tibial bypass graft  02/2008    Social History   Social History  . Marital Status: Divorced    Spouse Name: N/A  . Number of Children: N/A  . Years of Education: N/A   Occupational History  . Not on file.   Social History Main Topics  . Smoking status: Former Smoker -- 1.00 packs/day for 55 years    Types: Cigarettes    Quit date: 07/08/2015  . Smokeless tobacco: Never Used  . Alcohol Use: No  . Drug Use: No  . Sexual Activity: Not on file   Other Topics Concern  . Not on file   Social History Narrative    Family History  Problem Relation Age of Onset  . Aneurysm Father     No current facility-administered medications on file prior to encounter.   Current Outpatient Prescriptions on File Prior to Encounter  Medication Sig Dispense Refill  . amLODipine (NORVASC) 5 MG tablet Take 5 mg by mouth daily.    [name] aspirin 81 MG chewable tablet Chew 81 mg by mouth daily.    . fentaNYL (DURAGESIC - DOSED MCG/HR) 100 MCG/HR Place 100 mcg onto the skin every 3 (three) days.    [name] lisinopril (PRINIVIL,ZESTRIL) 20 MG tablet Take 20 mg by  mouth daily.    . naproxen (NAPROSYN) 500 MG tablet Take 500 mg by mouth 2 (two) times daily with a meal.    . Omega-3 Fatty Acids (FISH OIL) 1000 MG CAPS Take 1,000 mg by mouth 3 (three) times daily.    . traMADol (ULTRAM) 50 MG tablet Take 50 mg by mouth 2 (two) times daily.      Allergies  Allergen Reactions  . Lidocaine Itching    Had at MD for throat numbing and began itching in the evening    Review of Systems: As listed above, otherwise negative.  Physical Examination  Filed Vitals:   07/30/15 0546  BP: 161/67  Pulse: 64  Temp: 97.8 F (36.6 C)  TempSrc: Oral  Resp: 16  Weight: 111 lb (50.349 kg)  SpO2: 100%    General: A&O x 3, WDWN  Pulmonary: Sym exp, good air movt, CTAB, no rales, rhonchi, & wheezing  Cardiac: RRR, Nl S1, S2, no Murmurs, rubs or gallops  Gastrointestinal: soft, NTND, -G/R, - HSM, - masses, - CVAT B  Musculoskeletal: M/S 5/5 throughout , Extremities without ischemic changes   Laboratory: CBC:    Component Value Date/Time  WBC 11.1* 07/25/2015 1243   RBC 2.82* 07/25/2015 1243   HGB 9.4* 07/25/2015 1243   HCT 27.6* 07/25/2015 1243   PLT 283 07/25/2015 1243   MCV 97.9 07/25/2015 1243   MCH 33.3 07/25/2015 1243   MCHC 34.1 07/25/2015 1243   RDW 15.1 07/25/2015 1243    BMP:    Component Value Date/Time   NA 137 07/25/2015 1243   K 5.0 07/25/2015 1243   CL 107 07/25/2015 1243   CO2 22 07/25/2015 1243   GLUCOSE 103* 07/25/2015 1243   BUN 40* 07/25/2015 1243   CREATININE 1.14 07/25/2015 1243   CALCIUM 9.7 07/25/2015 1243   GFRNONAA >60 07/25/2015 1243   GFRAA >60 07/25/2015 1243    Coagulation: Lab Results  Component Value Date   INR 1.04 07/15/2015   INR 1.0 02/23/2008   No results found for: PTT  Lipids: No results found for: CHOL, TRIG, HDL, CHOLHDL, VLDL, LDLCALC, LDLDIRECT    Medical Decision Making  Rodney Wagner is a 70 y.o. male who presents with: asx B ICA stenosis >80% (L>>R), occluded R femo-pop BPG, BLE  intermittent claudication    The patient is scheduled for: L CEA I discussed with the patient the risks, benefits, and alternatives to carotid endarterectomy.   The patient is a candidate for carotid artery stenting. His right ICA may need that depending on his ENT's surgical plan. I discussed the procedural details of carotid endarterectomy with the patient.   The patient is aware that the risks of carotid endarterectomy include but are not limited to: bleeding, infection, stroke, myocardial infarction, death, cranial nerve injuries both temporary and permanent, neck hematoma, possible airway compromise, labile blood pressure post-operatively, cerebral hyperperfusion syndrome, and possible need for additional interventions in the future.   The patient is aware of the risks and agrees to proceed forward with the procedure.  Adele Barthel, MD [facility] Office: [phone] Pager: [phone]  07/30/2015, 7:15 AM

## 2015-07-30 NOTE — Op Note (Addendum)
OPERATIVE NOTE  PROCEDURE:   1.  Left carotid endarterectomy with bovine patch angioplasty 2.  Left intraoperative carotid ultrasound  PRE-OPERATIVE DIAGNOSIS: left asymptomatic carotid stenosis >80%  POST-OPERATIVE DIAGNOSIS: same as above   SURGEON: Adele Barthel, MD  ASSISTANT(S): Gerri Lins, PAC   ANESTHESIA: general  ESTIMATED BLOOD LOSS: 100 cc  FINDING(S): 1.  Continuous Doppler audible flow signatures are appropriate for each carotid artery. 2.  No evidence of intimal flap visualized on transverse or longitudinal ultrasonography. 3.  Heavily calcified carotid plaque.  SPECIMEN(S):  Hardened lymph node anterior to internal jugular vein   INDICATIONS:   Rodney Wagner is a 70 y.o. male who presents with left asymptomatic carotid stenosis >80%.  I discussed with the patient the risks, benefits, and alternatives to carotid endarterectomy.  I discussed the procedural details of carotid endarterectomy with the patient.  The patient is aware that the risks of carotid endarterectomy include but are not limited to: bleeding, infection, stroke, myocardial infarction, death, cranial nerve injuries both temporary and permanent, neck hematoma, possible airway compromise, labile blood pressure post-operatively, cerebral hyperperfusion syndrome, and possible need for additional interventions in the future. The patient is aware of the risks and agrees to proceed forward with the procedure.  DESCRIPTION: After full informed written consent was obtained from the patient, the patient was brought back to the operating room and placed supine upon the operating table.  Prior to induction, the patient received IV antibiotics.  After obtaining adequate anesthesia, the patient was placed into semi-Fowler position with a shoulder roll in place and the patient's neck slightly hyperextended and rotated away from the surgical site.  The patient was prepped in the standard fashion for a left carotid  endarterectomy.  I made an incision anterior to the sternocleidomastoid muscle and dissected down through the subcutaneous tissue.  The platysmas was opened with electrocautery.  Then I dissected down to the internal jugular vein.  This was dissected posteriorly until I obtained visualization of the common carotid artery.  This was dissected out and then an umbilical tape was placed around the common carotid artery and I loosely applied a Rumel tourniquet.  I then dissected in a periadventitial fashion along the common carotid artery up to the bifurcation.  I then identified the external carotid artery and the superior thyroid artery.  A 2-0 silk tie was looped around the superior thyroid artery, and I also dissected out the external carotid artery and placed a vessel loop around it.  In continuing the dissection to the internal carotid artery, I identified the facial vein.  This was ligated and then transected, giving me improved exposure of the internal carotid artery.  In the process of this dissection, the hypoglossal nerve was identified.  I then dissected out the internal carotid artery until I identified an area of soft tissue in the internal carotid artery.  I dissected slightly distal to this area, and placed an umbilical tape around the artery and loosely applied a Rumel tourniquet.  At this point, we gave the patient a therapeutic bolus of Heparin intravenously, 5000 units.   I would give another 2000 units of Heparin 30 minutes later to manifestation of some clotting.  After waiting 3 minutes, then I clamped the internal carotid artery, external carotid artery and then the common carotid artery.  I then made an arteriotomy in the common carotid artery with a 11 blade, and extended the arteriotomy with a Potts scissor down into the common carotid artery, then  I carried the arteriotomy through the bifurcation into the internal carotid artery until I reached an area that was not diseased.  At this point, I  took the 10 shunt that previously been prepared and I inserted it into the internal carotid artery.  The Rumel tourniquet was then applied to this end of the shunt.  I unclamped the shunt to verify retrograde blood flow in the internal carotid artery.  I then placed the other end of the shunt into the common carotid artery after unclamping the artery.  The Rumel was tightened down around the shunt.  At this point, I verified blood flow in the shunt with a continuous doppler.  At this point, I started the endarterectomy in the common carotid artery with a Technical brewer and carried this dissection down into the common carotid artery circumferentially.  Then I transected the plaque at a segment where it was adherent.  I then carried this dissection up into the external carotid artery.  The plaque was extracted by unclamping the external carotid artery and everting the artery.  The dissection was then carried into the internal carotid artery, extracting the remaining portion of the carotid plaque.  I passed the plaque off the field as a specimen.  I then spent the next 30 minutes removing intimal flaps and loose debris.  Eventually I reached the point where the residual plaque was densely adherent and any further dissection would compromise the integrity of the wall.  After verifying that there was no more loose intimal flaps or debris, I re-interrogated the entirety of this carotid artery.  At this point, I was satisfied that the minimal remaining disease was densely adherent to the wall and wall integrity was intact.  At this point, I then fashioned a bovine pericardial patch for the geometry of this artery and sewed it in place with two running stitch of 6-0 Prolene, one from each end.  Prior to completing this patch angioplasty, I removed the shunt first from the internal carotid artery, from which there was excellent backbleeding, and clamped it.  Then I removed the shunt from the common carotid artery, from  which there was excellent antegrade bleeding, and then clamped it.  At this point, I allowed the external carotid artery to backbleed, which was excellent.  Then I instilled heparinized saline in this patched artery and then completed the patch angioplasty in the usual fashion.  First, I released the clamp on the external carotid artery, then I released it on the common carotid artery.  After waiting a few seconds, I then released it on the internal carotid artery.  I then interrogated this patient's arteries with the continuous Doppler.  The audible waveforms in each artery were consistent with the expected characteristics for each artery.  The Sonosite probe was then sterilely draped and used to interrogate the carotid artery in both longitudinal and transverse views.  At this point, I washed out the wound, and placed Avitene  throughout.  I also gave the patient 30 mg of protamine to reverse his anticoagulation.   After waiting a few minutes, I removed the Avitene and washed out the wound.  There was no more active bleeding in the surgical site.   I then reapproximated the platysma muscle with a running stitch of 3-0 Vicryl.  The skin was then reapproximated with a running subcuticular 4-0 Monocryl stitch.  The skin was then cleaned, dried and Liquidband was used to reinforce the skin closure.  The patient woke without any  problems, neurologically intact.      COMPLICATIONS: none  CONDITION: stable   Adele Barthel, MD [facility] [place] Office: [phone] Pager: [phone]  07/30/2015, 10:25 AM

## 2015-07-30 NOTE — Care Management Note (Signed)
Case Management Note  Patient Details  Name: Larney Seachrist MRN: [id number] Date of Birth: 1946-04-23  Subjective/Objective:    Patient from home with spouse, s/p  Left carotid endarterectomy , NCM will continue to follow for dc needs.               Action/Plan:   Expected Discharge Date:                  Expected Discharge Plan:  [place]  In-House Referral:     Discharge planning Services  CM Consult  Post Acute Care Choice:    Choice offered to:     DME Arranged:    DME Agency:     HH Arranged:    [place] Agency:     Status of Service:  In process, will continue to follow  Medicare Important Message Given:    Date Medicare IM Given:    Medicare IM give by:    Date Additional Medicare IM Given:    Additional Medicare Important Message give by:     If discussed at [place] of Stay Meetings, dates discussed:    Additional Comments:  Zenon Mayo, RN 07/30/2015, 6:24 PM

## 2015-07-30 NOTE — Anesthesia Postprocedure Evaluation (Signed)
Anesthesia Post Note  Patient: Rodney Wagner  Procedure(s) Performed: Procedure(s) (LRB): Left ENDARTERECTOMY CAROTID (Left) PATCH ANGIOPLASTY (Left)  Patient location during evaluation: PACU Anesthesia Type: General Level of consciousness: awake, awake and alert and oriented Pain management: pain level controlled Vital Signs Assessment: post-procedure vital signs reviewed and stable Respiratory status: spontaneous breathing and nonlabored ventilation Cardiovascular status: blood pressure returned to baseline Anesthetic complications: no    Last Vitals:  Filed Vitals:   07/30/15 1630 07/30/15 1730  BP: 109/56 106/58  Pulse: 62 63  Temp:    Resp: 15 15    Last Pain:  Filed Vitals:   07/30/15 1759  PainSc: 4                  Elai Vanwyk COKER

## 2015-07-30 NOTE — Telephone Encounter (Signed)
LM for pt re appt, dpm °

## 2015-07-30 NOTE — Progress Notes (Signed)
Notified PA of pt's hgb of 7.6, order for redraw of CBC given. Will continue to monitor.

## 2015-07-30 NOTE — Telephone Encounter (Signed)
-----  Message from Sharee Pimple, RN sent at 07/30/2015 11:18 AM EDT ----- Regarding: schedule   ----- Message -----    From: [name], MD    Sent: 07/30/2015  10:44 AM      To: [facility] [address]  Ebbie Bourbon [phone] 1945-11-29  Procedure: L CEA, intraop ultrasound L carotid duplex  Asst: Lianne Cure, [facility]   Follow-up: 2 weeks

## 2015-07-30 NOTE — Anesthesia Preprocedure Evaluation (Addendum)
Anesthesia Evaluation  Patient identified by MRN, date of birth, ID band Patient awake    Reviewed: Allergy & Precautions, NPO status , Patient's Chart, lab work & pertinent test results  Airway Mallampati: I  TM Distance: >3 FB Neck ROM: Full    Dental  (+) Edentulous Lower, Edentulous Upper   Pulmonary COPD, former smoker,    breath sounds clear to auscultation       Cardiovascular hypertension, Pt. on medications + Peripheral Vascular Disease   Rhythm:Regular Rate:Normal     Neuro/Psych    GI/Hepatic   Endo/Other    Renal/GU      Musculoskeletal  (+) Arthritis ,   Abdominal   Peds  Hematology  (+) anemia , H/h 9.4/27.6   Anesthesia Other Findings   Reproductive/Obstetrics                           Anesthesia Physical Anesthesia Plan  ASA: III  Anesthesia Plan: General   Post-op Pain Management:    Induction: Intravenous  Airway Management Planned: Oral ETT  Additional Equipment: Arterial line  Intra-op Plan:   Post-operative Plan: Extubation in OR  Informed Consent: I have reviewed the patients History and Physical, chart, labs and discussed the procedure including the risks, benefits and alternatives for the proposed anesthesia with the patient or authorized representative who has indicated his/her understanding and acceptance.   Dental advisory given  Plan Discussed with: Anesthesiologist and Surgeon  Anesthesia Plan Comments:        Anesthesia Quick Evaluation

## 2015-07-30 NOTE — Progress Notes (Signed)
Pt states has stuffy nose. No chest congestion or cough.

## 2015-07-30 NOTE — Transfer of Care (Signed)
Immediate Anesthesia Transfer of Care Note  Patient: Rodney Wagner  Procedure(s) Performed: Procedure(s): ENDARTERECTOMY CAROTID-LEFT (Left)  Patient Location: PACU  Anesthesia Type:General  Level of Consciousness: awake, alert  and oriented  Airway & Oxygen Therapy: Patient Spontanous Breathing and Patient connected to nasal cannula oxygen  Post-op Assessment: Report given to RN, Post -op Vital signs reviewed and stable, Patient moving all extremities X 4 and Patient able to stick tongue midline  Post vital signs: Reviewed and stable  Last Vitals:  Filed Vitals:   07/30/15 0546  BP: 161/67  Pulse: 64  Temp: 36.6 C  Resp: 16    Complications: No apparent anesthesia complications

## 2015-07-30 NOTE — Anesthesia Procedure Notes (Signed)
Procedure Name: Intubation Date/Time: 07/30/2015 7:43 AM Performed by: Merrilyn Puma B Pre-anesthesia Checklist: Patient identified, Emergency Drugs available, Timeout performed, Suction available and Patient being monitored Patient Re-evaluated:Patient Re-evaluated prior to inductionOxygen Delivery Method: Circle system utilized Preoxygenation: Pre-oxygenation with 100% oxygen Intubation Type: IV induction Ventilation: Mask ventilation without difficulty and Oral airway inserted - appropriate to patient size Laryngoscope Size: Mac and 4 Grade View: Grade II Tube type: Oral Tube size: 7.5 mm Number of attempts: 1 Airway Equipment and Method: Stylet Placement Confirmation: CO2 detector,  positive ETCO2,  ETT inserted through vocal cords under direct vision and breath sounds checked- equal and bilateral Secured at: 22 cm Tube secured with: Tape Dental Injury: Teeth and Oropharynx as per pre-operative assessment

## 2015-07-31 ENCOUNTER — Encounter (HOSPITAL_COMMUNITY): Payer: Self-pay | Admitting: Vascular Surgery

## 2015-07-31 LAB — BASIC METABOLIC PANEL
ANION GAP: 10 (ref 5–15)
BUN: 22 mg/dL — AB (ref 6–20)
CHLORIDE: 103 mmol/L (ref 101–111)
CO2: 22 mmol/L (ref 22–32)
Calcium: 8.5 mg/dL — ABNORMAL LOW (ref 8.9–10.3)
Creatinine, Ser: 1.07 mg/dL (ref 0.61–1.24)
GFR calc Af Amer: >60 mL/min (ref >60)
GFR calc non Af Amer: >60 mL/min (ref >60)
GLUCOSE: 107 mg/dL — AB (ref 65–99)
POTASSIUM: 4.7 mmol/L (ref 3.5–5.1)
SODIUM: 135 mmol/L (ref 135–145)

## 2015-07-31 LAB — CBC
HCT: 26.2 % — ABNORMAL LOW (ref 39.0–52.0)
Hemoglobin: 9 g/dL — ABNORMAL LOW (ref 13.0–17.0)
MCH: 32.8 pg (ref 26.0–34.0)
MCHC: 34.4 g/dL (ref 30.0–36.0)
MCV: 95.6 fL (ref 78.0–100.0)
PLATELETS: 219 10*3/uL (ref 150–400)
RBC: 2.74 MIL/uL — ABNORMAL LOW (ref 4.22–5.81)
RDW: 16.7 % — AB (ref 11.5–15.5)
WBC: 12.4 10*3/uL — ABNORMAL HIGH (ref 4.0–10.5)

## 2015-07-31 LAB — TYPE AND SCREEN
ABO/RH(D): O NEG
Antibody Screen: NEGATIVE
UNIT DIVISION: 0
Unit division: 0

## 2015-07-31 LAB — URINE CULTURE
CULTURE: NO GROWTH
Special Requests: NORMAL

## 2015-07-31 NOTE — Progress Notes (Signed)
DC instructions given to patient and family. Questions answered. Reinforced driving restriction, lift restrictions, S&S of site for infection and stroke. Smoking caseation information given to patient and family. PIV DC, hemostasis achieved. L neck incision CDI, skin glue present. All belongings sent home with patient and family. eICU and CCMT notified of discharge. Pt to be escorted by NT via wheelchair to private vehicle driven by family.

## 2015-07-31 NOTE — Progress Notes (Addendum)
[facility] [place]  Subjective  - Doing well no new complaints.  He has walked, voided and taking PO's well.   Objective 119/54 71 98.6 F (37 C) (Oral) 13 95%  Intake/Output Summary (Last 24 hours) at 07/31/15 0725 Last data filed at 07/31/15 0636  Gross per 24 hour  Intake 3625.25 ml  Output   1900 ml  Net 1725.25 ml    Grip 5/5 B Incision clean and dry with out hematoma No tongue deviation and smile is symmetric   Assessment/Planning: POD #1 Left CEA Post op hemoglobin was 7.3 given 2 units PRBC now 9.0   D/C home in stable condition  [facility] 07/31/2015 7:25 AM --  Laboratory Lab Results:  Recent Labs  07/30/15 1637 07/31/15 0430  WBC 14.8* 12.4*  HGB 7.3* 9.0*  HCT 20.7* 26.2*  PLT 247 219   BMET  Recent Labs  07/30/15 1540 07/31/15 0430  NA  --  135  K  --  4.7  CL  --  103  CO2  --  22  GLUCOSE  --  107*  BUN  --  22*  CREATININE 1.01 1.07  CALCIUM  --  8.5*    COAG Lab Results  Component Value Date   INR 1.04 07/15/2015   INR 1.0 02/23/2008   No results found for: PTT  Addendum  I have independently interviewed and examined the patient, and I agree with the physician assistant's findings.  Transfused 2 u pRBC.  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  Appropriate response to transfusion without complications evident.  Normal neuro exam.  No hematoma in L neck.  Ok to D/C home.  Adele Barthel, MD [facility] [place] Office: [phone] Pager: [phone]  07/31/2015, 9:39 AM

## 2015-07-31 NOTE — Care Management Note (Signed)
Case Management Note  Patient Details  Name: Rodney Wagner MRN: [id number] Date of Birth: [birth year]  Subjective/Objective:     Left carotid endarterectomy               Action/Plan: Discharge Planning: AVS reviewed NCM spoke to pt and dtr at bedside. Wife and dtr at home to assist with care. Pt can afford his medications at home.   PCP -Neale Burly MD  Expected Discharge Date:  07/31/2015              Expected Discharge Plan:  Home/Self Care  In-House Referral:  NA  Discharge planning Services  CM Consult  Post Acute Care Choice:  NA Choice offered to:  NA  DME Arranged:  N/A DME Agency:  NA  HH Arranged:  NA HH Agency:  NA  Status of Service:  Completed, signed off  Medicare Important Message Given:    Date Medicare IM Given:    Medicare IM give by:    Date Additional Medicare IM Given:    Additional Medicare Important Message give by:     If discussed at [place] of Stay Meetings, dates discussed:    Additional Comments:  Erenest Rasher, RN 07/31/2015, 10:30 AM

## 2015-08-06 DIAGNOSIS — Z87891 Personal history of nicotine dependence: Secondary | ICD-10-CM | POA: Diagnosis not present

## 2015-08-06 DIAGNOSIS — Z7982 Long term (current) use of aspirin: Secondary | ICD-10-CM | POA: Diagnosis not present

## 2015-08-06 DIAGNOSIS — J449 Chronic obstructive pulmonary disease, unspecified: Secondary | ICD-10-CM | POA: Diagnosis not present

## 2015-08-06 DIAGNOSIS — Z79899 Other long term (current) drug therapy: Secondary | ICD-10-CM | POA: Diagnosis not present

## 2015-08-06 DIAGNOSIS — C090 Malignant neoplasm of tonsillar fossa: Secondary | ICD-10-CM | POA: Diagnosis not present

## 2015-08-06 DIAGNOSIS — Z803 Family history of malignant neoplasm of breast: Secondary | ICD-10-CM | POA: Diagnosis not present

## 2015-08-07 ENCOUNTER — Encounter: Payer: Self-pay | Admitting: Vascular Surgery

## 2015-08-07 DIAGNOSIS — D638 Anemia in other chronic diseases classified elsewhere: Secondary | ICD-10-CM | POA: Diagnosis not present

## 2015-08-07 DIAGNOSIS — C760 Malignant neoplasm of head, face and neck: Secondary | ICD-10-CM | POA: Diagnosis not present

## 2015-08-07 DIAGNOSIS — I6523 Occlusion and stenosis of bilateral carotid arteries: Secondary | ICD-10-CM | POA: Diagnosis not present

## 2015-08-07 DIAGNOSIS — Z9889 Other specified postprocedural states: Secondary | ICD-10-CM | POA: Diagnosis not present

## 2015-08-07 DIAGNOSIS — Z87891 Personal history of nicotine dependence: Secondary | ICD-10-CM | POA: Diagnosis not present

## 2015-08-08 NOTE — Discharge Summary (Signed)
[facility] Discharge Summary   Patient ID:  Rodney Wagner MRN: [id number] DOB/AGE: May 15, 1946 70 y.o.  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 Arthritis   . Chronic kidney disease   . COPD (chronic obstructive pulmonary disease) (HCC)   . Hypertension   . Carotid artery occlusion     Procedure(s): Left ENDARTERECTOMY CAROTID PATCH ANGIOPLASTY  Discharged Condition: good  HPI: Rodney Wagner is a 70 y.o. male who presents with chief complaint: R side oral cancer. The patient presents today for L CEA. This patient has known bilateral ICA stenosis with string sign on left. He presents for L CEA after being reschedule from two weeks ago due to bronchitis sx and elevated WBC. He was placed on a course of Levaquin. His UA was abnormal at that time. At thsi time, his bronchitis sx have resolved.[name]  asx B ICA stenosis >80% (L>>R), occluded R femo-pop BPG, BLE intermittent claudication   Hospital Course:  Rodney Wagner is a 70 y.o. male is S/P  Procedure(s): Left ENDARTERECTOMY CAROTID PATCH ANGIOPLASTY POD#1 Grip 5/5 B Incision clean and dry with out hematoma No tongue deviation and smile is symmetric Post op hemoglobin was 7.3 given 2 units PRBC now 9.0   D/C home in stable condition Consults:     Significant Diagnostic Studies: CBC Lab Results  Component Value Date   WBC 12.4* 07/31/2015   HGB 9.0* 07/31/2015   HCT 26.2* 07/31/2015   MCV 95.6 07/31/2015   PLT 219 07/31/2015    BMET    Component Value Date/Time   NA 135 07/31/2015 0430   K 4.7 07/31/2015 0430   CL 103 07/31/2015 0430   CO2 22 07/31/2015 0430   GLUCOSE 107* 07/31/2015 0430   BUN 22* 07/31/2015 0430    CREATININE 1.07 07/31/2015 0430   CALCIUM 8.5* 07/31/2015 0430   GFRNONAA >60 07/31/2015 0430   GFRAA >60 07/31/2015 0430   COAG Lab Results  Component Value Date   INR 1.04 07/15/2015   INR 1.0 02/23/2008     Disposition:  Discharge to :Home Discharge Instructions    Call MD for:  redness, tenderness, or signs of infection (pain, swelling, bleeding, redness, odor or green/yellow discharge around incision site)    Complete by:  As directed      Call MD for:  severe or increased pain, loss or decreased feeling  in affected limb(s)    Complete by:  As directed      Call MD for:  temperature >100.5    Complete by:  As directed      Discharge instructions    Complete by:  As directed   You may shower tonight.  Increase activity as you tolerate     Driving Restrictions    Complete by:  As directed   No driving for 1 week     Lifting restrictions    Complete by:  As directed   No heavy lifting for 6 weeks     Resume previous diet    Complete by:  As directed             Medication List    TAKE these medications        amLODipine 5 MG tablet  Commonly known as:  NORVASC  Take 5 mg by mouth daily.     aspirin 81 MG chewable tablet  Chew 81 mg by mouth daily.     cilostazol 100 MG tablet  Commonly known as:  PLETAL  Take 1 tablet by mouth 2 (two) times daily.     diazepam 5 MG tablet  Commonly known as:  VALIUM  Take 1 tablet by mouth 2 (two) times daily.     fentaNYL 100 MCG/HR  Commonly known as:  DURAGESIC - dosed mcg/hr  Place 100 mcg onto the skin every 3 (three) days.     Fish Oil 1000 MG Caps  Take 1,000 mg by mouth 3 (three) times daily.     levofloxacin 750 MG tablet  Commonly known as:  LEVAQUIN  Take 1 tablet (750 mg total) by mouth daily.     lisinopril 20 MG tablet  Commonly known as:  PRINIVIL,ZESTRIL  Take 20 mg by mouth daily.     naproxen 500 MG tablet  Commonly known as:  NAPROSYN  Take 500 mg by mouth 2 (two) times daily with a meal.      traMADol 50 MG tablet  Commonly known as:  ULTRAM  Take 50 mg by mouth 2 (two) times daily.       Verbal and written Discharge instructions given to the patient. Wound care per Discharge AVS     Follow-up Information    Follow up with Leonides Sake, MD In 2 weeks.   Specialties:  Vascular Surgery, Cardiology   Why:  Office will call you to arrange your appt (sent)   Contact information:   [address] [place] [address] [phone]       Signed: Clinton Gallant [facility] 08/08/2015, 9:56 AM  Addendum  I have independently interviewed and examined the patient, and I agree with the physician assistant's discharge summary.  The patient had an uneventful L CEA.  He will return for wound check in 2 weeks in the office.  Depending on the ENT's plans in regards to his R tonsillar cancer, we determine the plans for the right ICA stenosis, as if a radical neck dissection is needed, the right internal carotid artery might be resected anyway.  Leonides Sake, MD [facility] [place] Office: [phone] Pager: [phone]  08/08/2015, 10:54 AM    08/08/2015, 10:54 AM  --- For VQI Registry use --- Instructions: Press F2 to tab through selections.  Delete question if not applicable.   Modified Rankin score at D/C (0-6): Rankin Score=0  IV medication needed for:  1. Hypertension: No 2. Hypotension: No  Post-op Complications: No  1. Post-op CVA or TIA: No  If yes: Event classification (right eye, left eye, right cortical, left cortical, verterobasilar, other):   If yes: Timing of event (intra-op, <6 hrs post-op, >=6 hrs post-op, unknown):   2. CN injury: No  If yes: CN  injuried   3. Myocardial infarction: No  If yes: Dx by (EKG or clinical, Troponin):   4.  CHF: No  5.  Dysrhythmia (new): No  6. Wound infection: No  7. Reperfusion symptoms: No  8. Return to OR: No  If yes: return to OR for (bleeding, neurologic, other CEA incision,  other):   Discharge medications: Statin use:  No  for medical reason   ASA use:  Yes Beta blocker use:  No  for medical reason   ACE-Inhibitor use:  Yes P2Y12 Antagonist use: [ x] None, [ ]  Plavix, [ ]  Plasugrel, [ ]   Ticlopinine, [ ]  Ticagrelor, [ ]  Other, [ ]  No for medical reason, [ ]  Non-compliant, [ ]  Not-indicated Anti-coagulant use:  [x ] None, [ ]  Warfarin, [ ]  Rivaroxaban, [ ]  Dabigatran, [ ]  Other, [ ]  No for medical reason, [ ]  Non-compliant, [ ]  Not-indicated

## 2015-08-13 DIAGNOSIS — C099 Malignant neoplasm of tonsil, unspecified: Secondary | ICD-10-CM | POA: Diagnosis not present

## 2015-08-15 ENCOUNTER — Encounter: Payer: Self-pay | Admitting: Vascular Surgery

## 2015-08-15 ENCOUNTER — Ambulatory Visit (INDEPENDENT_AMBULATORY_CARE_PROVIDER_SITE_OTHER): Payer: Medicare Other | Admitting: Vascular Surgery

## 2015-08-15 VITALS — BP 146/72 | HR 63 | Temp 98.3°F | Resp 14 | Ht 69.0 in | Wt 116.0 lb

## 2015-08-15 DIAGNOSIS — I739 Peripheral vascular disease, unspecified: Principal | ICD-10-CM

## 2015-08-15 DIAGNOSIS — I779 Disorder of arteries and arterioles, unspecified: Secondary | ICD-10-CM

## 2015-08-15 NOTE — Progress Notes (Signed)
Postoperative Visit   History of Present Illness  Orville Greth is a 70 y.o. male who presents for postoperative follow-up for: L CEA (Date: 07/30/15).  The patient's neck incision is healed.  The patient has had no stroke or TIA symptoms.  For VQI Use Only  PRE-ADM LIVING: Home  AMB STATUS: Ambulatory  Social History   Social History  . Marital Status: Divorced    Spouse Name: N/A  . Number of Children: N/A  . Years of Education: N/A   Occupational History  . Not on file.   Social History Main Topics  . Smoking status: Former Smoker -- 1.00 packs/day for 55 years    Types: Cigarettes    Quit date: 07/08/2015  . Smokeless tobacco: Never Used  . Alcohol Use: No  . Drug Use: No  . Sexual Activity: Not on file   Other Topics Concern  . Not on file   Social History Narrative    Current Outpatient Prescriptions on File Prior to Visit  Medication Sig Dispense Refill  . amLODipine (NORVASC) 5 MG tablet Take 5 mg by mouth daily.    [name] aspirin 81 MG chewable tablet Chew 81 mg by mouth daily.    . cilostazol (PLETAL) 100 MG tablet Take 1 tablet by mouth 2 (two) times daily.    . diazepam (VALIUM) 5 MG tablet Take 1 tablet by mouth 2 (two) times daily.    . fentaNYL (DURAGESIC - DOSED MCG/HR) 100 MCG/HR Place 100 mcg onto the skin every 3 (three) days.    [name] lisinopril (PRINIVIL,ZESTRIL) 20 MG tablet Take 20 mg by mouth daily.    . naproxen (NAPROSYN) 500 MG tablet Take 500 mg by mouth 2 (two) times daily with a meal.    . Omega-3 Fatty Acids (FISH OIL) 1000 MG CAPS Take 1,000 mg by mouth 3 (three) times daily.    . traMADol (ULTRAM) 50 MG tablet Take 50 mg by mouth 2 (two) times daily.    [name] levofloxacin (LEVAQUIN) 750 MG tablet Take 1 tablet (750 mg total) by mouth daily. (Patient not taking: Reported on 08/15/2015) 5 tablet 0   No current facility-administered medications on file prior to visit.    Physical Examination  Filed Vitals:   08/15/15 1341 08/15/15 1342   BP: 142/70 146/72  Pulse: 63 63  Temp:    Resp:      L Neck: Incision is healed Neuro: CN 2-12 are intact, Motor strength is 5/5  bilaterally, sensation is  grossly intact  Medical Decision Making  Tarel Wandell is a 70 y.o. 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  I would NOT proceed with R CEA in a irradiated field while actively taking chemotherapy as the wound would not heal.  The patient should be considered for a RICA carotid stent that could be placed while he is getting his chemo/XRT.  First, he will need a R carotid angiogram to determine if his anatomy is compatible with stenting.    He has multiple procedures next week including: PEG placement, Mediport placement, and start of chemo/XRT.  He will call us to schedule the R carotid angiogram once he is ready.   In regards to his L CEA, the patient's left neck incision is healing with no stroke symptoms. I discussed in depth with the patient the nature of  atherosclerosis, and emphasized the importance of maximal medical management including strict control of blood pressure, blood glucose, and lipid levels, obtaining regular exercise, anti-platelet use and cessation of smoking.   The patient is currently on an antiplatelet: ASA. The patient is currently not on a statin: not medically indicated. The patient is aware that without maximal medical management the underlying atherosclerotic disease process will progress, limiting the benefit of any interventions. The patient's surveillance will included routine carotid duplex studies which will be completed in: 9 months, at which time the patient will be re-evaluated.   I emphasized the importance of routine surveillance of the carotid arteries as recurrence of stenosis is possible, especially with proper management of underlying atherosclerotic disease. The  patient agrees to participate in their maximal medical care and routine surveillance.  Thank you for allowing [place] to participate in this patient's care.  Adele Barthel, MD [facility] [place] Office: [phone] Pager: [phone]

## 2015-08-16 DIAGNOSIS — R1312 Dysphagia, oropharyngeal phase: Secondary | ICD-10-CM | POA: Diagnosis not present

## 2015-08-16 DIAGNOSIS — C099 Malignant neoplasm of tonsil, unspecified: Secondary | ICD-10-CM | POA: Diagnosis not present

## 2015-08-19 DIAGNOSIS — I878 Other specified disorders of veins: Secondary | ICD-10-CM | POA: Diagnosis not present

## 2015-08-19 DIAGNOSIS — Z452 Encounter for adjustment and management of vascular access device: Secondary | ICD-10-CM | POA: Diagnosis not present

## 2015-08-19 DIAGNOSIS — G4700 Insomnia, unspecified: Secondary | ICD-10-CM | POA: Diagnosis not present

## 2015-08-19 DIAGNOSIS — I25810 Atherosclerosis of coronary artery bypass graft(s) without angina pectoris: Secondary | ICD-10-CM | POA: Diagnosis not present

## 2015-08-19 DIAGNOSIS — M1990 Unspecified osteoarthritis, unspecified site: Secondary | ICD-10-CM | POA: Diagnosis not present

## 2015-08-19 DIAGNOSIS — J449 Chronic obstructive pulmonary disease, unspecified: Secondary | ICD-10-CM | POA: Diagnosis not present

## 2015-08-19 DIAGNOSIS — Z86718 Personal history of other venous thrombosis and embolism: Secondary | ICD-10-CM | POA: Diagnosis not present

## 2015-08-19 DIAGNOSIS — I2510 Atherosclerotic heart disease of native coronary artery without angina pectoris: Secondary | ICD-10-CM | POA: Diagnosis not present

## 2015-08-19 DIAGNOSIS — I110 Hypertensive heart disease with heart failure: Secondary | ICD-10-CM | POA: Diagnosis not present

## 2015-08-19 DIAGNOSIS — Z951 Presence of aortocoronary bypass graft: Secondary | ICD-10-CM | POA: Diagnosis not present

## 2015-08-19 DIAGNOSIS — Z7982 Long term (current) use of aspirin: Secondary | ICD-10-CM | POA: Diagnosis not present

## 2015-08-19 DIAGNOSIS — C099 Malignant neoplasm of tonsil, unspecified: Secondary | ICD-10-CM | POA: Diagnosis not present

## 2015-08-19 DIAGNOSIS — F17210 Nicotine dependence, cigarettes, uncomplicated: Secondary | ICD-10-CM | POA: Diagnosis not present

## 2015-08-19 DIAGNOSIS — I517 Cardiomegaly: Secondary | ICD-10-CM | POA: Diagnosis not present

## 2015-08-19 DIAGNOSIS — Z79899 Other long term (current) drug therapy: Secondary | ICD-10-CM | POA: Diagnosis not present

## 2015-08-19 DIAGNOSIS — Z87442 Personal history of urinary calculi: Secondary | ICD-10-CM | POA: Diagnosis not present

## 2015-08-19 DIAGNOSIS — Z905 Acquired absence of kidney: Secondary | ICD-10-CM | POA: Diagnosis not present

## 2015-08-20 DIAGNOSIS — K9423 Gastrostomy malfunction: Secondary | ICD-10-CM | POA: Diagnosis not present

## 2015-08-20 DIAGNOSIS — Z7982 Long term (current) use of aspirin: Secondary | ICD-10-CM | POA: Diagnosis not present

## 2015-08-20 DIAGNOSIS — I951 Orthostatic hypotension: Secondary | ICD-10-CM | POA: Diagnosis not present

## 2015-08-20 DIAGNOSIS — C090 Malignant neoplasm of tonsillar fossa: Secondary | ICD-10-CM | POA: Diagnosis not present

## 2015-08-20 DIAGNOSIS — K1230 Oral mucositis (ulcerative), unspecified: Secondary | ICD-10-CM | POA: Diagnosis not present

## 2015-08-20 DIAGNOSIS — J449 Chronic obstructive pulmonary disease, unspecified: Secondary | ICD-10-CM | POA: Diagnosis not present

## 2015-08-20 DIAGNOSIS — M549 Dorsalgia, unspecified: Secondary | ICD-10-CM | POA: Diagnosis not present

## 2015-08-20 DIAGNOSIS — Z87442 Personal history of urinary calculi: Secondary | ICD-10-CM | POA: Diagnosis not present

## 2015-08-20 DIAGNOSIS — Z7901 Long term (current) use of anticoagulants: Secondary | ICD-10-CM | POA: Diagnosis not present

## 2015-08-20 DIAGNOSIS — M1990 Unspecified osteoarthritis, unspecified site: Secondary | ICD-10-CM | POA: Diagnosis not present

## 2015-08-20 DIAGNOSIS — Z510 Encounter for antineoplastic radiation therapy: Secondary | ICD-10-CM | POA: Diagnosis not present

## 2015-08-20 DIAGNOSIS — Z483 Aftercare following surgery for neoplasm: Secondary | ICD-10-CM | POA: Diagnosis not present

## 2015-08-20 DIAGNOSIS — Z431 Encounter for attention to gastrostomy: Secondary | ICD-10-CM | POA: Diagnosis not present

## 2015-08-21 DIAGNOSIS — Z510 Encounter for antineoplastic radiation therapy: Secondary | ICD-10-CM | POA: Diagnosis not present

## 2015-08-21 DIAGNOSIS — C099 Malignant neoplasm of tonsil, unspecified: Secondary | ICD-10-CM | POA: Diagnosis not present

## 2015-08-21 DIAGNOSIS — K9423 Gastrostomy malfunction: Secondary | ICD-10-CM | POA: Diagnosis not present

## 2015-08-21 DIAGNOSIS — Z5111 Encounter for antineoplastic chemotherapy: Secondary | ICD-10-CM | POA: Diagnosis not present

## 2015-08-21 DIAGNOSIS — R112 Nausea with vomiting, unspecified: Secondary | ICD-10-CM | POA: Diagnosis not present

## 2015-08-21 DIAGNOSIS — K1230 Oral mucositis (ulcerative), unspecified: Secondary | ICD-10-CM | POA: Diagnosis not present

## 2015-08-21 DIAGNOSIS — C090 Malignant neoplasm of tonsillar fossa: Secondary | ICD-10-CM | POA: Diagnosis not present

## 2015-08-21 DIAGNOSIS — I951 Orthostatic hypotension: Secondary | ICD-10-CM | POA: Diagnosis not present

## 2015-08-22 DIAGNOSIS — J449 Chronic obstructive pulmonary disease, unspecified: Secondary | ICD-10-CM | POA: Diagnosis not present

## 2015-08-22 DIAGNOSIS — K9423 Gastrostomy malfunction: Secondary | ICD-10-CM | POA: Diagnosis not present

## 2015-08-22 DIAGNOSIS — M549 Dorsalgia, unspecified: Secondary | ICD-10-CM | POA: Diagnosis not present

## 2015-08-22 DIAGNOSIS — I951 Orthostatic hypotension: Secondary | ICD-10-CM | POA: Diagnosis not present

## 2015-08-22 DIAGNOSIS — C090 Malignant neoplasm of tonsillar fossa: Secondary | ICD-10-CM | POA: Diagnosis not present

## 2015-08-22 DIAGNOSIS — Z431 Encounter for attention to gastrostomy: Secondary | ICD-10-CM | POA: Diagnosis not present

## 2015-08-22 DIAGNOSIS — Z510 Encounter for antineoplastic radiation therapy: Secondary | ICD-10-CM | POA: Diagnosis not present

## 2015-08-22 DIAGNOSIS — Z483 Aftercare following surgery for neoplasm: Secondary | ICD-10-CM | POA: Diagnosis not present

## 2015-08-22 DIAGNOSIS — Z7982 Long term (current) use of aspirin: Secondary | ICD-10-CM | POA: Diagnosis not present

## 2015-08-22 DIAGNOSIS — Z7901 Long term (current) use of anticoagulants: Secondary | ICD-10-CM | POA: Diagnosis not present

## 2015-08-22 DIAGNOSIS — Z87442 Personal history of urinary calculi: Secondary | ICD-10-CM | POA: Diagnosis not present

## 2015-08-22 DIAGNOSIS — M1990 Unspecified osteoarthritis, unspecified site: Secondary | ICD-10-CM | POA: Diagnosis not present

## 2015-08-22 DIAGNOSIS — K1230 Oral mucositis (ulcerative), unspecified: Secondary | ICD-10-CM | POA: Diagnosis not present

## 2015-08-23 DIAGNOSIS — K9423 Gastrostomy malfunction: Secondary | ICD-10-CM | POA: Diagnosis not present

## 2015-08-23 DIAGNOSIS — K1230 Oral mucositis (ulcerative), unspecified: Secondary | ICD-10-CM | POA: Diagnosis not present

## 2015-08-23 DIAGNOSIS — Z510 Encounter for antineoplastic radiation therapy: Secondary | ICD-10-CM | POA: Diagnosis not present

## 2015-08-23 DIAGNOSIS — I951 Orthostatic hypotension: Secondary | ICD-10-CM | POA: Diagnosis not present

## 2015-08-23 DIAGNOSIS — C090 Malignant neoplasm of tonsillar fossa: Secondary | ICD-10-CM | POA: Diagnosis not present

## 2015-08-25 DIAGNOSIS — Z7901 Long term (current) use of anticoagulants: Secondary | ICD-10-CM | POA: Diagnosis not present

## 2015-08-25 DIAGNOSIS — J449 Chronic obstructive pulmonary disease, unspecified: Secondary | ICD-10-CM | POA: Diagnosis not present

## 2015-08-25 DIAGNOSIS — Z87442 Personal history of urinary calculi: Secondary | ICD-10-CM | POA: Diagnosis not present

## 2015-08-25 DIAGNOSIS — Z431 Encounter for attention to gastrostomy: Secondary | ICD-10-CM | POA: Diagnosis not present

## 2015-08-25 DIAGNOSIS — C090 Malignant neoplasm of tonsillar fossa: Secondary | ICD-10-CM | POA: Diagnosis not present

## 2015-08-25 DIAGNOSIS — Z7982 Long term (current) use of aspirin: Secondary | ICD-10-CM | POA: Diagnosis not present

## 2015-08-25 DIAGNOSIS — M549 Dorsalgia, unspecified: Secondary | ICD-10-CM | POA: Diagnosis not present

## 2015-08-25 DIAGNOSIS — M1990 Unspecified osteoarthritis, unspecified site: Secondary | ICD-10-CM | POA: Diagnosis not present

## 2015-08-25 DIAGNOSIS — Z483 Aftercare following surgery for neoplasm: Secondary | ICD-10-CM | POA: Diagnosis not present

## 2015-08-26 DIAGNOSIS — C090 Malignant neoplasm of tonsillar fossa: Secondary | ICD-10-CM | POA: Diagnosis not present

## 2015-08-26 DIAGNOSIS — K1230 Oral mucositis (ulcerative), unspecified: Secondary | ICD-10-CM | POA: Diagnosis not present

## 2015-08-26 DIAGNOSIS — I951 Orthostatic hypotension: Secondary | ICD-10-CM | POA: Diagnosis not present

## 2015-08-26 DIAGNOSIS — Z510 Encounter for antineoplastic radiation therapy: Secondary | ICD-10-CM | POA: Diagnosis not present

## 2015-08-26 DIAGNOSIS — K9423 Gastrostomy malfunction: Secondary | ICD-10-CM | POA: Diagnosis not present

## 2015-08-27 DIAGNOSIS — Z87442 Personal history of urinary calculi: Secondary | ICD-10-CM | POA: Diagnosis not present

## 2015-08-27 DIAGNOSIS — I951 Orthostatic hypotension: Secondary | ICD-10-CM | POA: Diagnosis not present

## 2015-08-27 DIAGNOSIS — K9423 Gastrostomy malfunction: Secondary | ICD-10-CM | POA: Diagnosis not present

## 2015-08-27 DIAGNOSIS — K1230 Oral mucositis (ulcerative), unspecified: Secondary | ICD-10-CM | POA: Diagnosis not present

## 2015-08-27 DIAGNOSIS — M549 Dorsalgia, unspecified: Secondary | ICD-10-CM | POA: Diagnosis not present

## 2015-08-27 DIAGNOSIS — Z7982 Long term (current) use of aspirin: Secondary | ICD-10-CM | POA: Diagnosis not present

## 2015-08-27 DIAGNOSIS — J449 Chronic obstructive pulmonary disease, unspecified: Secondary | ICD-10-CM | POA: Diagnosis not present

## 2015-08-27 DIAGNOSIS — Z431 Encounter for attention to gastrostomy: Secondary | ICD-10-CM | POA: Diagnosis not present

## 2015-08-27 DIAGNOSIS — Z483 Aftercare following surgery for neoplasm: Secondary | ICD-10-CM | POA: Diagnosis not present

## 2015-08-27 DIAGNOSIS — C090 Malignant neoplasm of tonsillar fossa: Secondary | ICD-10-CM | POA: Diagnosis not present

## 2015-08-27 DIAGNOSIS — Z7901 Long term (current) use of anticoagulants: Secondary | ICD-10-CM | POA: Diagnosis not present

## 2015-08-27 DIAGNOSIS — Z510 Encounter for antineoplastic radiation therapy: Secondary | ICD-10-CM | POA: Diagnosis not present

## 2015-08-27 DIAGNOSIS — M1990 Unspecified osteoarthritis, unspecified site: Secondary | ICD-10-CM | POA: Diagnosis not present

## 2015-08-28 DIAGNOSIS — Z9889 Other specified postprocedural states: Secondary | ICD-10-CM | POA: Diagnosis not present

## 2015-08-28 DIAGNOSIS — C099 Malignant neoplasm of tonsil, unspecified: Secondary | ICD-10-CM | POA: Diagnosis not present

## 2015-08-28 DIAGNOSIS — I951 Orthostatic hypotension: Secondary | ICD-10-CM | POA: Diagnosis not present

## 2015-08-28 DIAGNOSIS — I6523 Occlusion and stenosis of bilateral carotid arteries: Secondary | ICD-10-CM | POA: Diagnosis not present

## 2015-08-28 DIAGNOSIS — Z510 Encounter for antineoplastic radiation therapy: Secondary | ICD-10-CM | POA: Diagnosis not present

## 2015-08-28 DIAGNOSIS — C090 Malignant neoplasm of tonsillar fossa: Secondary | ICD-10-CM | POA: Diagnosis not present

## 2015-08-28 DIAGNOSIS — K1230 Oral mucositis (ulcerative), unspecified: Secondary | ICD-10-CM | POA: Diagnosis not present

## 2015-08-28 DIAGNOSIS — Z87891 Personal history of nicotine dependence: Secondary | ICD-10-CM | POA: Diagnosis not present

## 2015-08-28 DIAGNOSIS — K9423 Gastrostomy malfunction: Secondary | ICD-10-CM | POA: Diagnosis not present

## 2015-08-29 DIAGNOSIS — R112 Nausea with vomiting, unspecified: Secondary | ICD-10-CM | POA: Diagnosis not present

## 2015-08-29 DIAGNOSIS — C090 Malignant neoplasm of tonsillar fossa: Secondary | ICD-10-CM | POA: Diagnosis not present

## 2015-08-29 DIAGNOSIS — I951 Orthostatic hypotension: Secondary | ICD-10-CM | POA: Diagnosis not present

## 2015-08-29 DIAGNOSIS — Z5111 Encounter for antineoplastic chemotherapy: Secondary | ICD-10-CM | POA: Diagnosis not present

## 2015-08-29 DIAGNOSIS — K1230 Oral mucositis (ulcerative), unspecified: Secondary | ICD-10-CM | POA: Diagnosis not present

## 2015-08-29 DIAGNOSIS — C099 Malignant neoplasm of tonsil, unspecified: Secondary | ICD-10-CM | POA: Diagnosis not present

## 2015-08-29 DIAGNOSIS — Z510 Encounter for antineoplastic radiation therapy: Secondary | ICD-10-CM | POA: Diagnosis not present

## 2015-08-29 DIAGNOSIS — K9423 Gastrostomy malfunction: Secondary | ICD-10-CM | POA: Diagnosis not present

## 2015-09-01 DIAGNOSIS — I951 Orthostatic hypotension: Secondary | ICD-10-CM | POA: Diagnosis not present

## 2015-09-01 DIAGNOSIS — C090 Malignant neoplasm of tonsillar fossa: Secondary | ICD-10-CM | POA: Diagnosis not present

## 2015-09-01 DIAGNOSIS — Z510 Encounter for antineoplastic radiation therapy: Secondary | ICD-10-CM | POA: Diagnosis not present

## 2015-09-01 DIAGNOSIS — K9423 Gastrostomy malfunction: Secondary | ICD-10-CM | POA: Diagnosis not present

## 2015-09-01 DIAGNOSIS — K1230 Oral mucositis (ulcerative), unspecified: Secondary | ICD-10-CM | POA: Diagnosis not present

## 2015-09-02 DIAGNOSIS — C090 Malignant neoplasm of tonsillar fossa: Secondary | ICD-10-CM | POA: Diagnosis not present

## 2015-09-02 DIAGNOSIS — K1230 Oral mucositis (ulcerative), unspecified: Secondary | ICD-10-CM | POA: Diagnosis not present

## 2015-09-02 DIAGNOSIS — I951 Orthostatic hypotension: Secondary | ICD-10-CM | POA: Diagnosis not present

## 2015-09-02 DIAGNOSIS — K9423 Gastrostomy malfunction: Secondary | ICD-10-CM | POA: Diagnosis not present

## 2015-09-02 DIAGNOSIS — Z510 Encounter for antineoplastic radiation therapy: Secondary | ICD-10-CM | POA: Diagnosis not present

## 2015-09-03 DIAGNOSIS — I951 Orthostatic hypotension: Secondary | ICD-10-CM | POA: Diagnosis not present

## 2015-09-03 DIAGNOSIS — Z5111 Encounter for antineoplastic chemotherapy: Secondary | ICD-10-CM | POA: Diagnosis not present

## 2015-09-03 DIAGNOSIS — C090 Malignant neoplasm of tonsillar fossa: Secondary | ICD-10-CM | POA: Diagnosis not present

## 2015-09-03 DIAGNOSIS — K1230 Oral mucositis (ulcerative), unspecified: Secondary | ICD-10-CM | POA: Diagnosis not present

## 2015-09-03 DIAGNOSIS — C099 Malignant neoplasm of tonsil, unspecified: Secondary | ICD-10-CM | POA: Diagnosis not present

## 2015-09-03 DIAGNOSIS — K9423 Gastrostomy malfunction: Secondary | ICD-10-CM | POA: Diagnosis not present

## 2015-09-03 DIAGNOSIS — Z510 Encounter for antineoplastic radiation therapy: Secondary | ICD-10-CM | POA: Diagnosis not present

## 2015-09-04 DIAGNOSIS — K1230 Oral mucositis (ulcerative), unspecified: Secondary | ICD-10-CM | POA: Diagnosis not present

## 2015-09-04 DIAGNOSIS — C090 Malignant neoplasm of tonsillar fossa: Secondary | ICD-10-CM | POA: Diagnosis not present

## 2015-09-04 DIAGNOSIS — K9423 Gastrostomy malfunction: Secondary | ICD-10-CM | POA: Diagnosis not present

## 2015-09-04 DIAGNOSIS — Z5111 Encounter for antineoplastic chemotherapy: Secondary | ICD-10-CM | POA: Diagnosis not present

## 2015-09-04 DIAGNOSIS — C099 Malignant neoplasm of tonsil, unspecified: Secondary | ICD-10-CM | POA: Diagnosis not present

## 2015-09-04 DIAGNOSIS — Z510 Encounter for antineoplastic radiation therapy: Secondary | ICD-10-CM | POA: Diagnosis not present

## 2015-09-04 DIAGNOSIS — I951 Orthostatic hypotension: Secondary | ICD-10-CM | POA: Diagnosis not present

## 2015-09-05 DIAGNOSIS — Z7902 Long term (current) use of antithrombotics/antiplatelets: Secondary | ICD-10-CM | POA: Diagnosis not present

## 2015-09-05 DIAGNOSIS — R591 Generalized enlarged lymph nodes: Secondary | ICD-10-CM | POA: Diagnosis not present

## 2015-09-05 DIAGNOSIS — C099 Malignant neoplasm of tonsil, unspecified: Secondary | ICD-10-CM | POA: Diagnosis not present

## 2015-09-05 DIAGNOSIS — D638 Anemia in other chronic diseases classified elsewhere: Secondary | ICD-10-CM | POA: Diagnosis not present

## 2015-09-05 DIAGNOSIS — C090 Malignant neoplasm of tonsillar fossa: Secondary | ICD-10-CM | POA: Diagnosis not present

## 2015-09-05 DIAGNOSIS — Z87891 Personal history of nicotine dependence: Secondary | ICD-10-CM | POA: Diagnosis not present

## 2015-09-05 DIAGNOSIS — C140 Malignant neoplasm of pharynx, unspecified: Secondary | ICD-10-CM | POA: Diagnosis not present

## 2015-09-05 DIAGNOSIS — K9423 Gastrostomy malfunction: Secondary | ICD-10-CM | POA: Diagnosis not present

## 2015-09-05 DIAGNOSIS — K5909 Other constipation: Secondary | ICD-10-CM | POA: Diagnosis not present

## 2015-09-05 DIAGNOSIS — D649 Anemia, unspecified: Secondary | ICD-10-CM | POA: Diagnosis not present

## 2015-09-05 DIAGNOSIS — E860 Dehydration: Secondary | ICD-10-CM | POA: Diagnosis not present

## 2015-09-05 DIAGNOSIS — I2510 Atherosclerotic heart disease of native coronary artery without angina pectoris: Secondary | ICD-10-CM | POA: Diagnosis not present

## 2015-09-05 DIAGNOSIS — D6481 Anemia due to antineoplastic chemotherapy: Secondary | ICD-10-CM | POA: Diagnosis not present

## 2015-09-05 DIAGNOSIS — E785 Hyperlipidemia, unspecified: Secondary | ICD-10-CM | POA: Diagnosis not present

## 2015-09-05 DIAGNOSIS — Z86718 Personal history of other venous thrombosis and embolism: Secondary | ICD-10-CM | POA: Diagnosis not present

## 2015-09-05 DIAGNOSIS — G4700 Insomnia, unspecified: Secondary | ICD-10-CM | POA: Diagnosis not present

## 2015-09-05 DIAGNOSIS — Z431 Encounter for attention to gastrostomy: Secondary | ICD-10-CM | POA: Diagnosis not present

## 2015-09-05 DIAGNOSIS — K5900 Constipation, unspecified: Secondary | ICD-10-CM | POA: Diagnosis not present

## 2015-09-05 DIAGNOSIS — Z79899 Other long term (current) drug therapy: Secondary | ICD-10-CM | POA: Diagnosis not present

## 2015-09-05 DIAGNOSIS — D0008 Carcinoma in situ of pharynx: Secondary | ICD-10-CM | POA: Diagnosis not present

## 2015-09-05 DIAGNOSIS — Z7982 Long term (current) use of aspirin: Secondary | ICD-10-CM | POA: Diagnosis not present

## 2015-09-05 DIAGNOSIS — J449 Chronic obstructive pulmonary disease, unspecified: Secondary | ICD-10-CM | POA: Diagnosis not present

## 2015-09-05 DIAGNOSIS — I739 Peripheral vascular disease, unspecified: Secondary | ICD-10-CM | POA: Diagnosis not present

## 2015-09-05 DIAGNOSIS — I10 Essential (primary) hypertension: Secondary | ICD-10-CM | POA: Diagnosis not present

## 2015-09-07 DIAGNOSIS — D638 Anemia in other chronic diseases classified elsewhere: Secondary | ICD-10-CM | POA: Diagnosis not present

## 2015-09-07 DIAGNOSIS — C099 Malignant neoplasm of tonsil, unspecified: Secondary | ICD-10-CM | POA: Diagnosis not present

## 2015-09-07 DIAGNOSIS — R591 Generalized enlarged lymph nodes: Secondary | ICD-10-CM | POA: Diagnosis not present

## 2015-09-07 DIAGNOSIS — Z87891 Personal history of nicotine dependence: Secondary | ICD-10-CM | POA: Diagnosis not present

## 2015-09-07 DIAGNOSIS — Z431 Encounter for attention to gastrostomy: Secondary | ICD-10-CM | POA: Diagnosis not present

## 2015-09-09 DIAGNOSIS — R070 Pain in throat: Secondary | ICD-10-CM | POA: Diagnosis not present

## 2015-09-09 DIAGNOSIS — M545 Low back pain: Secondary | ICD-10-CM | POA: Diagnosis not present

## 2015-09-09 DIAGNOSIS — D701 Agranulocytosis secondary to cancer chemotherapy: Secondary | ICD-10-CM | POA: Diagnosis not present

## 2015-09-10 DIAGNOSIS — C099 Malignant neoplasm of tonsil, unspecified: Secondary | ICD-10-CM | POA: Diagnosis not present

## 2015-09-10 DIAGNOSIS — Z510 Encounter for antineoplastic radiation therapy: Secondary | ICD-10-CM | POA: Diagnosis not present

## 2015-09-10 DIAGNOSIS — C090 Malignant neoplasm of tonsillar fossa: Secondary | ICD-10-CM | POA: Diagnosis not present

## 2015-09-10 DIAGNOSIS — I951 Orthostatic hypotension: Secondary | ICD-10-CM | POA: Diagnosis not present

## 2015-09-10 DIAGNOSIS — K1230 Oral mucositis (ulcerative), unspecified: Secondary | ICD-10-CM | POA: Diagnosis not present

## 2015-09-10 DIAGNOSIS — K9423 Gastrostomy malfunction: Secondary | ICD-10-CM | POA: Diagnosis not present

## 2015-09-11 DIAGNOSIS — K1230 Oral mucositis (ulcerative), unspecified: Secondary | ICD-10-CM | POA: Diagnosis not present

## 2015-09-11 DIAGNOSIS — Z5111 Encounter for antineoplastic chemotherapy: Secondary | ICD-10-CM | POA: Diagnosis not present

## 2015-09-11 DIAGNOSIS — Z510 Encounter for antineoplastic radiation therapy: Secondary | ICD-10-CM | POA: Diagnosis not present

## 2015-09-11 DIAGNOSIS — I951 Orthostatic hypotension: Secondary | ICD-10-CM | POA: Diagnosis not present

## 2015-09-11 DIAGNOSIS — C099 Malignant neoplasm of tonsil, unspecified: Secondary | ICD-10-CM | POA: Diagnosis not present

## 2015-09-11 DIAGNOSIS — C090 Malignant neoplasm of tonsillar fossa: Secondary | ICD-10-CM | POA: Diagnosis not present

## 2015-09-11 DIAGNOSIS — K9423 Gastrostomy malfunction: Secondary | ICD-10-CM | POA: Diagnosis not present

## 2015-09-11 DIAGNOSIS — R1312 Dysphagia, oropharyngeal phase: Secondary | ICD-10-CM | POA: Diagnosis not present

## 2015-09-12 DIAGNOSIS — D539 Nutritional anemia, unspecified: Secondary | ICD-10-CM | POA: Diagnosis not present

## 2015-09-12 DIAGNOSIS — I951 Orthostatic hypotension: Secondary | ICD-10-CM | POA: Diagnosis not present

## 2015-09-12 DIAGNOSIS — Z510 Encounter for antineoplastic radiation therapy: Secondary | ICD-10-CM | POA: Diagnosis not present

## 2015-09-12 DIAGNOSIS — D638 Anemia in other chronic diseases classified elsewhere: Secondary | ICD-10-CM | POA: Diagnosis not present

## 2015-09-12 DIAGNOSIS — K9423 Gastrostomy malfunction: Secondary | ICD-10-CM | POA: Diagnosis not present

## 2015-09-12 DIAGNOSIS — C090 Malignant neoplasm of tonsillar fossa: Secondary | ICD-10-CM | POA: Diagnosis not present

## 2015-09-12 DIAGNOSIS — Z87891 Personal history of nicotine dependence: Secondary | ICD-10-CM | POA: Diagnosis not present

## 2015-09-12 DIAGNOSIS — Z931 Gastrostomy status: Secondary | ICD-10-CM | POA: Diagnosis not present

## 2015-09-12 DIAGNOSIS — C099 Malignant neoplasm of tonsil, unspecified: Secondary | ICD-10-CM | POA: Diagnosis not present

## 2015-09-12 DIAGNOSIS — Z431 Encounter for attention to gastrostomy: Secondary | ICD-10-CM | POA: Diagnosis not present

## 2015-09-12 DIAGNOSIS — K1230 Oral mucositis (ulcerative), unspecified: Secondary | ICD-10-CM | POA: Diagnosis not present

## 2015-09-12 DIAGNOSIS — G893 Neoplasm related pain (acute) (chronic): Secondary | ICD-10-CM | POA: Diagnosis not present

## 2015-09-12 DIAGNOSIS — R591 Generalized enlarged lymph nodes: Secondary | ICD-10-CM | POA: Diagnosis not present

## 2015-09-13 DIAGNOSIS — K1230 Oral mucositis (ulcerative), unspecified: Secondary | ICD-10-CM | POA: Diagnosis not present

## 2015-09-13 DIAGNOSIS — Z510 Encounter for antineoplastic radiation therapy: Secondary | ICD-10-CM | POA: Diagnosis not present

## 2015-09-13 DIAGNOSIS — I951 Orthostatic hypotension: Secondary | ICD-10-CM | POA: Diagnosis not present

## 2015-09-13 DIAGNOSIS — K9423 Gastrostomy malfunction: Secondary | ICD-10-CM | POA: Diagnosis not present

## 2015-09-13 DIAGNOSIS — C090 Malignant neoplasm of tonsillar fossa: Secondary | ICD-10-CM | POA: Diagnosis not present

## 2015-09-16 DIAGNOSIS — N179 Acute kidney failure, unspecified: Secondary | ICD-10-CM | POA: Diagnosis not present

## 2015-09-16 DIAGNOSIS — E875 Hyperkalemia: Secondary | ICD-10-CM | POA: Diagnosis not present

## 2015-09-16 DIAGNOSIS — S31104A Unspecified open wound of abdominal wall, left lower quadrant without penetration into peritoneal cavity, initial encounter: Secondary | ICD-10-CM | POA: Diagnosis not present

## 2015-09-16 DIAGNOSIS — C090 Malignant neoplasm of tonsillar fossa: Secondary | ICD-10-CM | POA: Diagnosis not present

## 2015-09-16 DIAGNOSIS — Z510 Encounter for antineoplastic radiation therapy: Secondary | ICD-10-CM | POA: Diagnosis not present

## 2015-09-16 DIAGNOSIS — S21301A Unspecified open wound of right front wall of thorax with penetration into thoracic cavity, initial encounter: Secondary | ICD-10-CM | POA: Diagnosis not present

## 2015-09-17 ENCOUNTER — Telehealth: Payer: Self-pay

## 2015-09-17 DIAGNOSIS — Z87891 Personal history of nicotine dependence: Secondary | ICD-10-CM | POA: Diagnosis not present

## 2015-09-17 DIAGNOSIS — R591 Generalized enlarged lymph nodes: Secondary | ICD-10-CM | POA: Diagnosis not present

## 2015-09-17 DIAGNOSIS — C090 Malignant neoplasm of tonsillar fossa: Secondary | ICD-10-CM | POA: Diagnosis not present

## 2015-09-17 DIAGNOSIS — C099 Malignant neoplasm of tonsil, unspecified: Secondary | ICD-10-CM | POA: Diagnosis not present

## 2015-09-17 DIAGNOSIS — E875 Hyperkalemia: Secondary | ICD-10-CM | POA: Diagnosis not present

## 2015-09-17 DIAGNOSIS — N179 Acute kidney failure, unspecified: Secondary | ICD-10-CM | POA: Diagnosis not present

## 2015-09-17 DIAGNOSIS — Z510 Encounter for antineoplastic radiation therapy: Secondary | ICD-10-CM | POA: Diagnosis not present

## 2015-09-17 DIAGNOSIS — D638 Anemia in other chronic diseases classified elsewhere: Secondary | ICD-10-CM | POA: Diagnosis not present

## 2015-09-17 DIAGNOSIS — Z431 Encounter for attention to gastrostomy: Secondary | ICD-10-CM | POA: Diagnosis not present

## 2015-09-17 NOTE — Telephone Encounter (Signed)
rec'd phone call from nurse at Dr. Pearlie Oyster office/ Radiation Oncology.  Reported "the pt. has a stitch on left neck, that is inflamed, and draining a pus- like / bloody drainage.  Stated Dr. Isidore Moos is requesting an appt. ASAP to evaluate.  Also reported that the pt. Is currently receiving radiation to the right tonsil, and bilateral neck, and Dr. Isidore Moos doesn't want the area of left neck to get worse/ and potentially be adversely affected by the radiation.  Contacted pt.  Appt. given for 09/18/15 @ 1:15 PM with Nurse Practitioner.  Pt. agreed with plan.

## 2015-09-18 ENCOUNTER — Ambulatory Visit (INDEPENDENT_AMBULATORY_CARE_PROVIDER_SITE_OTHER): Payer: Medicare Other | Admitting: Family

## 2015-09-18 ENCOUNTER — Encounter: Payer: Self-pay | Admitting: Family

## 2015-09-18 VITALS — BP 128/53 | HR 68 | Temp 97.5°F | Resp 16 | Ht 67.0 in | Wt 117.1 lb

## 2015-09-18 DIAGNOSIS — C090 Malignant neoplasm of tonsillar fossa: Secondary | ICD-10-CM | POA: Diagnosis not present

## 2015-09-18 DIAGNOSIS — E875 Hyperkalemia: Secondary | ICD-10-CM | POA: Diagnosis not present

## 2015-09-18 DIAGNOSIS — Z5189 Encounter for other specified aftercare: Secondary | ICD-10-CM

## 2015-09-18 DIAGNOSIS — Z510 Encounter for antineoplastic radiation therapy: Secondary | ICD-10-CM | POA: Diagnosis not present

## 2015-09-18 DIAGNOSIS — Z9889 Other specified postprocedural states: Secondary | ICD-10-CM | POA: Insufficient documentation

## 2015-09-18 DIAGNOSIS — N179 Acute kidney failure, unspecified: Secondary | ICD-10-CM | POA: Diagnosis not present

## 2015-09-18 DIAGNOSIS — I6523 Occlusion and stenosis of bilateral carotid arteries: Secondary | ICD-10-CM

## 2015-09-18 NOTE — Patient Instructions (Signed)
Stroke Prevention Some medical conditions and behaviors are associated with an increased chance of having a stroke. You may prevent a stroke by making healthy choices and managing medical conditions. HOW CAN I REDUCE MY RISK OF HAVING A STROKE?   Stay physically active. Get at least 30 minutes of activity on most or all days.  Do not smoke. It may also be helpful to avoid exposure to secondhand smoke.  Limit alcohol use. Moderate alcohol use is considered to be:  No more than 2 drinks per day for men.  No more than 1 drink per day for nonpregnant women.  Eat healthy foods. This involves:  Eating 5 or more servings of fruits and vegetables a day.  Making dietary changes that address high blood pressure (hypertension), high cholesterol, diabetes, or obesity.  Manage your cholesterol levels.  Making food choices that are high in fiber and low in saturated fat, trans fat, and cholesterol may control cholesterol levels.  Take any prescribed medicines to control cholesterol as directed by your health care provider.  Manage your diabetes.  Controlling your carbohydrate and sugar intake is recommended to manage diabetes.  Take any prescribed medicines to control diabetes as directed by your health care provider.  Control your hypertension.  Making food choices that are low in salt (sodium), saturated fat, trans fat, and cholesterol is recommended to manage hypertension.  Ask your health care provider if you need treatment to lower your blood pressure. Take any prescribed medicines to control hypertension as directed by your health care provider.  If you are 18-39 years of age, have your blood pressure checked every 3-5 years. If you are 40 years of age or older, have your blood pressure checked every year.  Maintain a healthy weight.  Reducing calorie intake and making food choices that are low in sodium, saturated fat, trans fat, and cholesterol are recommended to manage  weight.  Stop drug abuse.  Avoid taking birth control pills.  Talk to your health care provider about the risks of taking birth control pills if you are over 35 years old, smoke, get migraines, or have ever had a blood clot.  Get evaluated for sleep disorders (sleep apnea).  Talk to your health care provider about getting a sleep evaluation if you snore a lot or have excessive sleepiness.  Take medicines only as directed by your health care provider.  For some people, aspirin or blood thinners (anticoagulants) are helpful in reducing the risk of forming abnormal blood clots that can lead to stroke. If you have the irregular heart rhythm of atrial fibrillation, you should be on a blood thinner unless there is a good reason you cannot take them.  Understand all your medicine instructions.  Make sure that other conditions (such as anemia or atherosclerosis) are addressed. SEEK IMMEDIATE MEDICAL CARE IF:   You have sudden weakness or numbness of the face, arm, or leg, especially on one side of the body.  Your face or eyelid droops to one side.  You have sudden confusion.  You have trouble speaking (aphasia) or understanding.  You have sudden trouble seeing in one or both eyes.  You have sudden trouble walking.  You have dizziness.  You have a loss of balance or coordination.  You have a sudden, severe headache with no known cause.  You have new chest pain or an irregular heartbeat. Any of these symptoms may represent a serious problem that is an emergency. Do not wait to see if the symptoms will   go away. Get medical help at once. Call your local emergency services (911 in [place] Do not drive yourself to the hospital.   This information is not intended to replace advice given to you by your health care provider. Make sure you discuss any questions you have with your health care provider.   Document Released: 06/11/2004 Document Revised: 05/25/2014 Document Reviewed:  11/04/2012 Elsevier Interactive Patient Education 2016 [employer]  

## 2015-09-18 NOTE — Progress Notes (Signed)
Postoperative Visit   History of Present Illness  Rodney Wagner is a 70 y.o. male patient of Rodney Wagner who is s/p L CEA (Date: 07/30/15).The patient has had no stroke or TIA symptoms. The pt returns today at Rodney Wagner request to evaluate reported inflamed area of left side of neck with pus/bloody drainage. Pt states this has improved from yesterday. He denies pain, denies fever or chills. 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   For VQI Use Only  PRE-ADM LIVING: Home  AMB STATUS: Ambulatory  Social History   Social History  . Marital Status: Divorced    Spouse Name: N/A  . Number of Children: N/A  . Years of Education: N/A   Occupational History  . Not on file.   Social History Main Topics  . Smoking status: Former Smoker -- 1.00 packs/day for 55 years    Types: Cigarettes    Quit date: 07/08/2015  . Smokeless tobacco: Never Used  . Alcohol Use: No  . Drug Use: No  . Sexual Activity: Not on file   Other Topics Concern  . Not on file   Social History Narrative    Current Outpatient Prescriptions on File Prior to Visit  Medication Sig Dispense Refill  . amLODipine (NORVASC) 5 MG tablet Take 5 mg by mouth daily.    [name] aspirin 81 MG chewable tablet Chew 81 mg by mouth daily.    . cilostazol (PLETAL) 100 MG tablet Take 1 tablet by mouth 2 (two) times daily.    . diazepam (VALIUM) 5 MG tablet Take 1 tablet by mouth 2 (two) times daily.    . fentaNYL (DURAGESIC - DOSED MCG/HR) 100 MCG/HR Place 100 mcg onto the skin every 3 (three) days.    [name] levofloxacin (LEVAQUIN) 750 MG tablet Take 1 tablet (750 mg total) by mouth daily. 5 tablet 0  . lisinopril (PRINIVIL,ZESTRIL) 20 MG tablet Take 20 mg by mouth daily.    . naproxen (NAPROSYN) 500 MG tablet Take 500 mg by mouth 2 (two) times daily with a meal.    . Omega-3 Fatty Acids (FISH OIL) 1000 MG CAPS Take 1,000 mg by mouth 3 (three) times daily.    . traMADol (ULTRAM) 50 MG tablet Take 50 mg by  mouth 2 (two) times daily.     No current facility-administered medications on file prior to visit.    Physical Examination  Filed Vitals:   09/18/15 1239 09/18/15 1240  BP: 112/59 128/53  Pulse: 68   Temp: 97.5 F (36.4 C)   TempSrc: Oral   Resp: 16   Height: 5\' 7"  (1.702 m)   Weight: 117 lb 1.6 oz (53.116 kg)    Body mass index is 18.34 kg/(m^2).  Left Neck: Incision is healed. Small area at distal portion of his left CEA incision that is slightly raised, no drainage, no erythema. Neuro: CN 2-12 are intact, Motor strength is 5/5  bilaterally, sensation is grossly intact  Medical Decision Making  Rodney Wagner is a 70 y.o. 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 Rodney Wagner spoke with pt, examined pt, and expressed a small amount of serosanguinous drainage from the small distal raised area of the left side neck incision. Silver Nitrate stick application to small amount of bleeding applied since pt states tape does not stick to his neck and dressings he has applied have dropped off. Bleeding stopped, small dark discoloration from silver nitrate left.  No purulent drainage, no erythema.  Follow up with Rodney Wagner in about 2 weeks for left CEA incision check.  Excerpt from Rodney Wagner 08/15/15 assessment:  In regards to the R ICA stenoses, the patient is asx, so the R tonsillar cancer takes priority. I would NOT proceed with R CEA in a irradiated field while actively taking chemotherapy as the wound would not heal.  The patient should be considered for a RICA carotid stent that could be placed while he is getting his chemo/XRT.  First, he will need a R carotid angiogram to determine if his anatomy is compatible with stenting.   He has multiple procedures next week including: PEG placement, Mediport placement, and start of chemo/XRT.  He will call us to schedule the R carotid angiogram once he is ready.    Rodney Wagner, Rodney Leyden, RN, MSN,  FNP-C [facility] Office: [phone]  09/18/2015, 12:54 PM  Clinic MD: Rodney Wagner

## 2015-09-19 DIAGNOSIS — E875 Hyperkalemia: Secondary | ICD-10-CM | POA: Diagnosis not present

## 2015-09-19 DIAGNOSIS — Z95828 Presence of other vascular implants and grafts: Secondary | ICD-10-CM | POA: Diagnosis not present

## 2015-09-19 DIAGNOSIS — Z510 Encounter for antineoplastic radiation therapy: Secondary | ICD-10-CM | POA: Diagnosis not present

## 2015-09-19 DIAGNOSIS — N179 Acute kidney failure, unspecified: Secondary | ICD-10-CM | POA: Diagnosis not present

## 2015-09-19 DIAGNOSIS — C090 Malignant neoplasm of tonsillar fossa: Secondary | ICD-10-CM | POA: Diagnosis not present

## 2015-09-20 DIAGNOSIS — Z510 Encounter for antineoplastic radiation therapy: Secondary | ICD-10-CM | POA: Diagnosis not present

## 2015-09-20 DIAGNOSIS — N179 Acute kidney failure, unspecified: Secondary | ICD-10-CM | POA: Diagnosis not present

## 2015-09-20 DIAGNOSIS — E875 Hyperkalemia: Secondary | ICD-10-CM | POA: Diagnosis not present

## 2015-09-20 DIAGNOSIS — C090 Malignant neoplasm of tonsillar fossa: Secondary | ICD-10-CM | POA: Diagnosis not present

## 2015-09-22 DIAGNOSIS — C760 Malignant neoplasm of head, face and neck: Secondary | ICD-10-CM | POA: Diagnosis not present

## 2015-09-23 DIAGNOSIS — N179 Acute kidney failure, unspecified: Secondary | ICD-10-CM | POA: Diagnosis not present

## 2015-09-23 DIAGNOSIS — Z510 Encounter for antineoplastic radiation therapy: Secondary | ICD-10-CM | POA: Diagnosis not present

## 2015-09-23 DIAGNOSIS — E875 Hyperkalemia: Secondary | ICD-10-CM | POA: Diagnosis not present

## 2015-09-23 DIAGNOSIS — C090 Malignant neoplasm of tonsillar fossa: Secondary | ICD-10-CM | POA: Diagnosis not present

## 2015-09-24 DIAGNOSIS — N179 Acute kidney failure, unspecified: Secondary | ICD-10-CM | POA: Diagnosis not present

## 2015-09-24 DIAGNOSIS — Z510 Encounter for antineoplastic radiation therapy: Secondary | ICD-10-CM | POA: Diagnosis not present

## 2015-09-24 DIAGNOSIS — E875 Hyperkalemia: Secondary | ICD-10-CM | POA: Diagnosis not present

## 2015-09-24 DIAGNOSIS — C090 Malignant neoplasm of tonsillar fossa: Secondary | ICD-10-CM | POA: Diagnosis not present

## 2015-09-24 DIAGNOSIS — E860 Dehydration: Secondary | ICD-10-CM | POA: Diagnosis not present

## 2015-09-25 DIAGNOSIS — C099 Malignant neoplasm of tonsil, unspecified: Secondary | ICD-10-CM | POA: Diagnosis not present

## 2015-09-25 DIAGNOSIS — C090 Malignant neoplasm of tonsillar fossa: Secondary | ICD-10-CM | POA: Diagnosis not present

## 2015-09-25 DIAGNOSIS — Z510 Encounter for antineoplastic radiation therapy: Secondary | ICD-10-CM | POA: Diagnosis not present

## 2015-09-25 DIAGNOSIS — E860 Dehydration: Secondary | ICD-10-CM | POA: Diagnosis not present

## 2015-09-25 DIAGNOSIS — R1312 Dysphagia, oropharyngeal phase: Secondary | ICD-10-CM | POA: Diagnosis not present

## 2015-09-25 DIAGNOSIS — N179 Acute kidney failure, unspecified: Secondary | ICD-10-CM | POA: Diagnosis not present

## 2015-09-25 DIAGNOSIS — E875 Hyperkalemia: Secondary | ICD-10-CM | POA: Diagnosis not present

## 2015-09-26 DIAGNOSIS — D638 Anemia in other chronic diseases classified elsewhere: Secondary | ICD-10-CM | POA: Diagnosis not present

## 2015-09-26 DIAGNOSIS — Z510 Encounter for antineoplastic radiation therapy: Secondary | ICD-10-CM | POA: Diagnosis not present

## 2015-09-26 DIAGNOSIS — R591 Generalized enlarged lymph nodes: Secondary | ICD-10-CM | POA: Diagnosis not present

## 2015-09-26 DIAGNOSIS — K9423 Gastrostomy malfunction: Secondary | ICD-10-CM | POA: Diagnosis not present

## 2015-09-26 DIAGNOSIS — Z931 Gastrostomy status: Secondary | ICD-10-CM | POA: Diagnosis not present

## 2015-09-26 DIAGNOSIS — Z87891 Personal history of nicotine dependence: Secondary | ICD-10-CM | POA: Diagnosis not present

## 2015-09-26 DIAGNOSIS — E875 Hyperkalemia: Secondary | ICD-10-CM | POA: Diagnosis not present

## 2015-09-26 DIAGNOSIS — N179 Acute kidney failure, unspecified: Secondary | ICD-10-CM | POA: Diagnosis not present

## 2015-09-26 DIAGNOSIS — C099 Malignant neoplasm of tonsil, unspecified: Secondary | ICD-10-CM | POA: Diagnosis not present

## 2015-09-26 DIAGNOSIS — Z431 Encounter for attention to gastrostomy: Secondary | ICD-10-CM | POA: Diagnosis not present

## 2015-09-26 DIAGNOSIS — C090 Malignant neoplasm of tonsillar fossa: Secondary | ICD-10-CM | POA: Diagnosis not present

## 2015-09-27 ENCOUNTER — Encounter: Payer: Self-pay | Admitting: Vascular Surgery

## 2015-09-27 DIAGNOSIS — C090 Malignant neoplasm of tonsillar fossa: Secondary | ICD-10-CM | POA: Diagnosis not present

## 2015-09-27 DIAGNOSIS — E860 Dehydration: Secondary | ICD-10-CM | POA: Diagnosis not present

## 2015-09-27 DIAGNOSIS — E875 Hyperkalemia: Secondary | ICD-10-CM | POA: Diagnosis not present

## 2015-09-27 DIAGNOSIS — C099 Malignant neoplasm of tonsil, unspecified: Secondary | ICD-10-CM | POA: Diagnosis not present

## 2015-09-27 DIAGNOSIS — Z510 Encounter for antineoplastic radiation therapy: Secondary | ICD-10-CM | POA: Diagnosis not present

## 2015-09-27 DIAGNOSIS — N179 Acute kidney failure, unspecified: Secondary | ICD-10-CM | POA: Diagnosis not present

## 2015-09-30 DIAGNOSIS — N179 Acute kidney failure, unspecified: Secondary | ICD-10-CM | POA: Diagnosis not present

## 2015-09-30 DIAGNOSIS — E875 Hyperkalemia: Secondary | ICD-10-CM | POA: Diagnosis not present

## 2015-09-30 DIAGNOSIS — Z510 Encounter for antineoplastic radiation therapy: Secondary | ICD-10-CM | POA: Diagnosis not present

## 2015-09-30 DIAGNOSIS — C099 Malignant neoplasm of tonsil, unspecified: Secondary | ICD-10-CM | POA: Diagnosis not present

## 2015-09-30 DIAGNOSIS — D638 Anemia in other chronic diseases classified elsewhere: Secondary | ICD-10-CM | POA: Diagnosis not present

## 2015-09-30 DIAGNOSIS — C090 Malignant neoplasm of tonsillar fossa: Secondary | ICD-10-CM | POA: Diagnosis not present

## 2015-09-30 DIAGNOSIS — E860 Dehydration: Secondary | ICD-10-CM | POA: Diagnosis not present

## 2015-10-01 DIAGNOSIS — C090 Malignant neoplasm of tonsillar fossa: Secondary | ICD-10-CM | POA: Diagnosis not present

## 2015-10-01 DIAGNOSIS — E875 Hyperkalemia: Secondary | ICD-10-CM | POA: Diagnosis not present

## 2015-10-01 DIAGNOSIS — Z510 Encounter for antineoplastic radiation therapy: Secondary | ICD-10-CM | POA: Diagnosis not present

## 2015-10-01 DIAGNOSIS — C099 Malignant neoplasm of tonsil, unspecified: Secondary | ICD-10-CM | POA: Diagnosis not present

## 2015-10-01 DIAGNOSIS — N179 Acute kidney failure, unspecified: Secondary | ICD-10-CM | POA: Diagnosis not present

## 2015-10-02 DIAGNOSIS — E875 Hyperkalemia: Secondary | ICD-10-CM | POA: Diagnosis not present

## 2015-10-02 DIAGNOSIS — N179 Acute kidney failure, unspecified: Secondary | ICD-10-CM | POA: Diagnosis not present

## 2015-10-02 DIAGNOSIS — Z510 Encounter for antineoplastic radiation therapy: Secondary | ICD-10-CM | POA: Diagnosis not present

## 2015-10-02 DIAGNOSIS — C090 Malignant neoplasm of tonsillar fossa: Secondary | ICD-10-CM | POA: Diagnosis not present

## 2015-10-03 DIAGNOSIS — C090 Malignant neoplasm of tonsillar fossa: Secondary | ICD-10-CM | POA: Diagnosis not present

## 2015-10-03 DIAGNOSIS — Z510 Encounter for antineoplastic radiation therapy: Secondary | ICD-10-CM | POA: Diagnosis not present

## 2015-10-03 DIAGNOSIS — C760 Malignant neoplasm of head, face and neck: Secondary | ICD-10-CM | POA: Diagnosis not present

## 2015-10-03 DIAGNOSIS — E875 Hyperkalemia: Secondary | ICD-10-CM | POA: Diagnosis not present

## 2015-10-03 DIAGNOSIS — N179 Acute kidney failure, unspecified: Secondary | ICD-10-CM | POA: Diagnosis not present

## 2015-10-04 ENCOUNTER — Ambulatory Visit: Payer: Medicare Other | Admitting: Vascular Surgery

## 2015-10-04 DIAGNOSIS — N179 Acute kidney failure, unspecified: Secondary | ICD-10-CM | POA: Diagnosis not present

## 2015-10-04 DIAGNOSIS — E875 Hyperkalemia: Secondary | ICD-10-CM | POA: Diagnosis not present

## 2015-10-04 DIAGNOSIS — C090 Malignant neoplasm of tonsillar fossa: Secondary | ICD-10-CM | POA: Diagnosis not present

## 2015-10-04 DIAGNOSIS — Z510 Encounter for antineoplastic radiation therapy: Secondary | ICD-10-CM | POA: Diagnosis not present

## 2015-10-07 DIAGNOSIS — E875 Hyperkalemia: Secondary | ICD-10-CM | POA: Diagnosis not present

## 2015-10-07 DIAGNOSIS — Z510 Encounter for antineoplastic radiation therapy: Secondary | ICD-10-CM | POA: Diagnosis not present

## 2015-10-07 DIAGNOSIS — N179 Acute kidney failure, unspecified: Secondary | ICD-10-CM | POA: Diagnosis not present

## 2015-10-07 DIAGNOSIS — C090 Malignant neoplasm of tonsillar fossa: Secondary | ICD-10-CM | POA: Diagnosis not present

## 2015-10-08 DIAGNOSIS — I10 Essential (primary) hypertension: Secondary | ICD-10-CM | POA: Diagnosis not present

## 2015-10-08 DIAGNOSIS — E785 Hyperlipidemia, unspecified: Secondary | ICD-10-CM | POA: Diagnosis not present

## 2015-10-08 DIAGNOSIS — E875 Hyperkalemia: Secondary | ICD-10-CM | POA: Diagnosis not present

## 2015-10-08 DIAGNOSIS — G894 Chronic pain syndrome: Secondary | ICD-10-CM | POA: Diagnosis not present

## 2015-10-08 DIAGNOSIS — E860 Dehydration: Secondary | ICD-10-CM | POA: Diagnosis not present

## 2015-10-08 DIAGNOSIS — I739 Peripheral vascular disease, unspecified: Secondary | ICD-10-CM | POA: Diagnosis not present

## 2015-10-08 DIAGNOSIS — C099 Malignant neoplasm of tonsil, unspecified: Secondary | ICD-10-CM | POA: Diagnosis not present

## 2015-10-08 DIAGNOSIS — Z7982 Long term (current) use of aspirin: Secondary | ICD-10-CM | POA: Diagnosis not present

## 2015-10-08 DIAGNOSIS — Z79899 Other long term (current) drug therapy: Secondary | ICD-10-CM | POA: Diagnosis not present

## 2015-10-08 DIAGNOSIS — D6489 Other specified anemias: Secondary | ICD-10-CM | POA: Diagnosis not present

## 2015-10-08 DIAGNOSIS — T451X5A Adverse effect of antineoplastic and immunosuppressive drugs, initial encounter: Secondary | ICD-10-CM | POA: Diagnosis not present

## 2015-10-08 DIAGNOSIS — C090 Malignant neoplasm of tonsillar fossa: Secondary | ICD-10-CM | POA: Diagnosis not present

## 2015-10-09 DIAGNOSIS — I10 Essential (primary) hypertension: Secondary | ICD-10-CM | POA: Diagnosis not present

## 2015-10-09 DIAGNOSIS — Z79899 Other long term (current) drug therapy: Secondary | ICD-10-CM | POA: Diagnosis not present

## 2015-10-09 DIAGNOSIS — Z7982 Long term (current) use of aspirin: Secondary | ICD-10-CM | POA: Diagnosis not present

## 2015-10-09 DIAGNOSIS — T451X5A Adverse effect of antineoplastic and immunosuppressive drugs, initial encounter: Secondary | ICD-10-CM | POA: Diagnosis not present

## 2015-10-09 DIAGNOSIS — E860 Dehydration: Secondary | ICD-10-CM | POA: Diagnosis not present

## 2015-10-09 DIAGNOSIS — D6489 Other specified anemias: Secondary | ICD-10-CM | POA: Diagnosis not present

## 2015-10-09 DIAGNOSIS — C099 Malignant neoplasm of tonsil, unspecified: Secondary | ICD-10-CM | POA: Diagnosis not present

## 2015-10-09 DIAGNOSIS — C098 Malignant neoplasm of overlapping sites of tonsil: Secondary | ICD-10-CM | POA: Diagnosis not present

## 2015-10-09 DIAGNOSIS — E875 Hyperkalemia: Secondary | ICD-10-CM | POA: Diagnosis not present

## 2015-10-09 DIAGNOSIS — E785 Hyperlipidemia, unspecified: Secondary | ICD-10-CM | POA: Diagnosis not present

## 2015-10-09 DIAGNOSIS — I739 Peripheral vascular disease, unspecified: Secondary | ICD-10-CM | POA: Diagnosis not present

## 2015-10-09 DIAGNOSIS — G894 Chronic pain syndrome: Secondary | ICD-10-CM | POA: Diagnosis not present

## 2015-10-10 DIAGNOSIS — I739 Peripheral vascular disease, unspecified: Secondary | ICD-10-CM | POA: Diagnosis not present

## 2015-10-10 DIAGNOSIS — E860 Dehydration: Secondary | ICD-10-CM | POA: Diagnosis not present

## 2015-10-10 DIAGNOSIS — T451X5A Adverse effect of antineoplastic and immunosuppressive drugs, initial encounter: Secondary | ICD-10-CM | POA: Diagnosis not present

## 2015-10-10 DIAGNOSIS — Z79899 Other long term (current) drug therapy: Secondary | ICD-10-CM | POA: Diagnosis not present

## 2015-10-10 DIAGNOSIS — C099 Malignant neoplasm of tonsil, unspecified: Secondary | ICD-10-CM | POA: Diagnosis not present

## 2015-10-10 DIAGNOSIS — G894 Chronic pain syndrome: Secondary | ICD-10-CM | POA: Diagnosis not present

## 2015-10-10 DIAGNOSIS — C090 Malignant neoplasm of tonsillar fossa: Secondary | ICD-10-CM | POA: Diagnosis not present

## 2015-10-10 DIAGNOSIS — E875 Hyperkalemia: Secondary | ICD-10-CM | POA: Diagnosis not present

## 2015-10-10 DIAGNOSIS — E785 Hyperlipidemia, unspecified: Secondary | ICD-10-CM | POA: Diagnosis not present

## 2015-10-10 DIAGNOSIS — D6489 Other specified anemias: Secondary | ICD-10-CM | POA: Diagnosis not present

## 2015-10-10 DIAGNOSIS — Z7982 Long term (current) use of aspirin: Secondary | ICD-10-CM | POA: Diagnosis not present

## 2015-10-10 DIAGNOSIS — I10 Essential (primary) hypertension: Secondary | ICD-10-CM | POA: Diagnosis not present

## 2015-10-11 DIAGNOSIS — I10 Essential (primary) hypertension: Secondary | ICD-10-CM | POA: Diagnosis not present

## 2015-10-11 DIAGNOSIS — Z79899 Other long term (current) drug therapy: Secondary | ICD-10-CM | POA: Diagnosis not present

## 2015-10-11 DIAGNOSIS — Z7982 Long term (current) use of aspirin: Secondary | ICD-10-CM | POA: Diagnosis not present

## 2015-10-11 DIAGNOSIS — E860 Dehydration: Secondary | ICD-10-CM | POA: Diagnosis not present

## 2015-10-11 DIAGNOSIS — E785 Hyperlipidemia, unspecified: Secondary | ICD-10-CM | POA: Diagnosis not present

## 2015-10-11 DIAGNOSIS — I739 Peripheral vascular disease, unspecified: Secondary | ICD-10-CM | POA: Diagnosis not present

## 2015-10-11 DIAGNOSIS — G894 Chronic pain syndrome: Secondary | ICD-10-CM | POA: Diagnosis not present

## 2015-10-11 DIAGNOSIS — T451X5A Adverse effect of antineoplastic and immunosuppressive drugs, initial encounter: Secondary | ICD-10-CM | POA: Diagnosis not present

## 2015-10-11 DIAGNOSIS — D6489 Other specified anemias: Secondary | ICD-10-CM | POA: Diagnosis not present

## 2015-10-11 DIAGNOSIS — C099 Malignant neoplasm of tonsil, unspecified: Secondary | ICD-10-CM | POA: Diagnosis not present

## 2015-10-11 DIAGNOSIS — E875 Hyperkalemia: Secondary | ICD-10-CM | POA: Diagnosis not present

## 2015-10-12 DIAGNOSIS — G894 Chronic pain syndrome: Secondary | ICD-10-CM | POA: Diagnosis not present

## 2015-10-12 DIAGNOSIS — E875 Hyperkalemia: Secondary | ICD-10-CM | POA: Diagnosis not present

## 2015-10-12 DIAGNOSIS — C098 Malignant neoplasm of overlapping sites of tonsil: Secondary | ICD-10-CM | POA: Diagnosis not present

## 2015-10-12 DIAGNOSIS — Z7982 Long term (current) use of aspirin: Secondary | ICD-10-CM | POA: Diagnosis not present

## 2015-10-12 DIAGNOSIS — C099 Malignant neoplasm of tonsil, unspecified: Secondary | ICD-10-CM | POA: Diagnosis not present

## 2015-10-12 DIAGNOSIS — E860 Dehydration: Secondary | ICD-10-CM | POA: Diagnosis not present

## 2015-10-12 DIAGNOSIS — E785 Hyperlipidemia, unspecified: Secondary | ICD-10-CM | POA: Diagnosis not present

## 2015-10-12 DIAGNOSIS — T451X5A Adverse effect of antineoplastic and immunosuppressive drugs, initial encounter: Secondary | ICD-10-CM | POA: Diagnosis not present

## 2015-10-12 DIAGNOSIS — D6489 Other specified anemias: Secondary | ICD-10-CM | POA: Diagnosis not present

## 2015-10-12 DIAGNOSIS — I10 Essential (primary) hypertension: Secondary | ICD-10-CM | POA: Diagnosis not present

## 2015-10-12 DIAGNOSIS — I739 Peripheral vascular disease, unspecified: Secondary | ICD-10-CM | POA: Diagnosis not present

## 2015-10-12 DIAGNOSIS — Z79899 Other long term (current) drug therapy: Secondary | ICD-10-CM | POA: Diagnosis not present

## 2015-10-17 DEATH — deceased

## 2015-11-21 ENCOUNTER — Telehealth: Payer: Self-pay | Admitting: Vascular Surgery

## 2015-11-21 NOTE — Telephone Encounter (Signed)
Per Nurse request I called patient to schedule a follow up appt w/ Dr.Chen. The patient's phone number was dissconnected, Unable to reach him by phone.  Also: Pt had an appt with Dr.Chen on 10/04/15 and he no showed. When he was called to reschedule, the pt declined and stated his infection was cleared.

## 2016-07-07 IMAGING — PT NM PET TUM IMG INITIAL (PI) SKULL BASE T - THIGH
1 of 8 series · 2 of 25 positions shown · non-contrast
Comparison: Neck CT of 07/04/2015.

CLINICAL DATA: Initial treatment strategy for staging of head neck
cancer..

EXAM:
NUCLEAR MEDICINE PET SKULL BASE TO THIGH
TECHNIQUE: 5.7 mCi F-18 FDG was injected intravenously. Full-ring PET imaging
was performed from the skull base to thigh after the radiotracer. CT
data was obtained and used for attenuation correction and anatomic
localization.
FASTING BLOOD GLUCOSE:  Value: 96 mg/dl

[Series 4: ct [id number]_th 5.0 b31f · axial · 5.0mm · 0.98mm/px · z∈[-1188,-332]mm · 2 of 215 slices shown]
[im 1/215  brain]
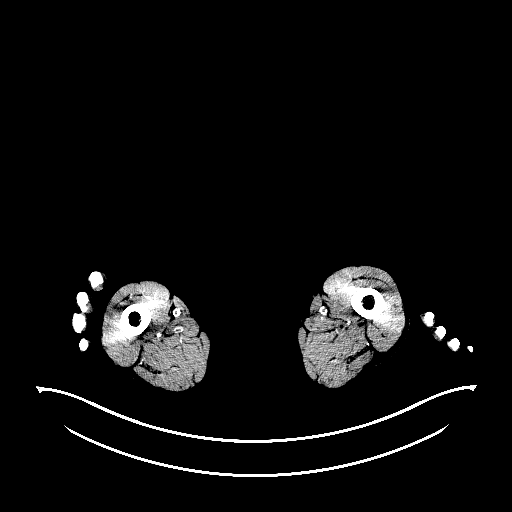
[im 215/215  brain]
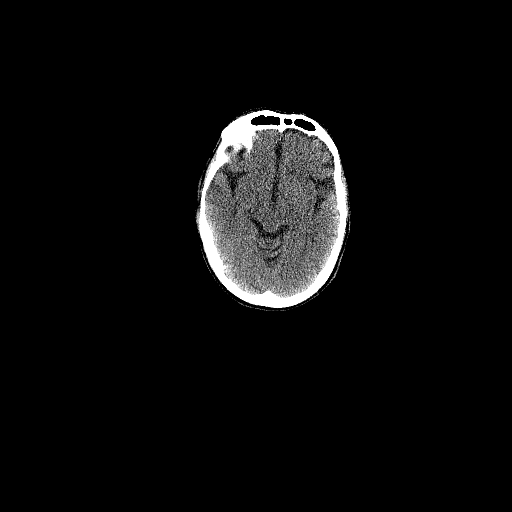

[2 of 25 positions shown; findings below may reference images not displayed]

FINDINGS: NECK

Hypermetabolism corresponding to the infiltrative right sided
oropharyngeal mass. This measures a S.U.V. max of 22.9, including on
image 24/series 4.

Right-sided level 2 node measures 8 mm and a S.U.V. max of 9.4 on
image 33/ series 4. No contralateral nodal hypermetabolism.

CHEST

No areas of abnormal hypermetabolism.

ABDOMEN/PELVIS

No areas of abnormal hypermetabolism.

SKELETON

No abnormal marrow activity.

CT IMAGES PERFORMED FOR ATTENUATION CORRECTION

Dense carotid atherosclerosis as described previously.

Multivessel coronary artery atherosclerosis. Mild cardiomegaly. Mild
centrilobular emphysema. Right renal vascular calcification. Absent
left kidney. Well-circumscribed low-density liver lesions which are
likely cysts. Colonic stool burden suggests constipation. Mild
prostatomegaly. Osteopenia. Severe L1 compression deformity,
suboptimally evaluated.
IMPRESSION: 1. Right-sided oropharyngeal primary with right level 2 nodal
metastasis.
2. No hypermetabolic extracervical metastasis.
3.  Atherosclerosis, including within the coronary arteries.
4. Absent left kidney.
5.  Possible constipation.
6. Severe L1 compression deformity.
